# Patient Record
Sex: Male | Born: 1935
Health system: Southern US, Community
[De-identification: ages and names within clinical notes are randomized; demographics above are authoritative.]

## PROBLEM LIST (undated history)

## (undated) DIAGNOSIS — I6529 Occlusion and stenosis of unspecified carotid artery: Secondary | ICD-10-CM

## (undated) DIAGNOSIS — I10 Essential (primary) hypertension: Secondary | ICD-10-CM

## (undated) DIAGNOSIS — C801 Malignant (primary) neoplasm, unspecified: Secondary | ICD-10-CM

## (undated) HISTORY — DX: Malignant (primary) neoplasm, unspecified: C80.1

## (undated) HISTORY — DX: Occlusion and stenosis of unspecified carotid artery: I65.29

## (undated) HISTORY — PX: SKIN CANCER EXCISION: SHX779

## (undated) HISTORY — DX: Essential (primary) hypertension: I10

## (undated) HISTORY — PX: TOOTH EXTRACTION: SUR596

## (undated) HISTORY — PX: TONSILLECTOMY: SUR1361

---

## 2011-10-29 DIAGNOSIS — R7309 Other abnormal glucose: Secondary | ICD-10-CM | POA: Diagnosis not present

## 2011-10-29 DIAGNOSIS — I10 Essential (primary) hypertension: Secondary | ICD-10-CM | POA: Diagnosis not present

## 2011-10-29 DIAGNOSIS — E785 Hyperlipidemia, unspecified: Secondary | ICD-10-CM | POA: Diagnosis not present

## 2011-12-07 DIAGNOSIS — Z23 Encounter for immunization: Secondary | ICD-10-CM | POA: Diagnosis not present

## 2012-02-01 DIAGNOSIS — I10 Essential (primary) hypertension: Secondary | ICD-10-CM | POA: Diagnosis not present

## 2012-02-01 DIAGNOSIS — R7309 Other abnormal glucose: Secondary | ICD-10-CM | POA: Diagnosis not present

## 2012-02-01 DIAGNOSIS — E785 Hyperlipidemia, unspecified: Secondary | ICD-10-CM | POA: Diagnosis not present

## 2012-04-29 DIAGNOSIS — R7309 Other abnormal glucose: Secondary | ICD-10-CM | POA: Diagnosis not present

## 2012-04-29 DIAGNOSIS — E785 Hyperlipidemia, unspecified: Secondary | ICD-10-CM | POA: Diagnosis not present

## 2012-10-27 DIAGNOSIS — R7309 Other abnormal glucose: Secondary | ICD-10-CM | POA: Diagnosis not present

## 2012-10-27 DIAGNOSIS — E785 Hyperlipidemia, unspecified: Secondary | ICD-10-CM | POA: Diagnosis not present

## 2012-10-27 DIAGNOSIS — I10 Essential (primary) hypertension: Secondary | ICD-10-CM | POA: Diagnosis not present

## 2012-12-01 DIAGNOSIS — H524 Presbyopia: Secondary | ICD-10-CM | POA: Diagnosis not present

## 2012-12-01 DIAGNOSIS — H5200 Hypermetropia, unspecified eye: Secondary | ICD-10-CM | POA: Diagnosis not present

## 2012-12-01 DIAGNOSIS — H25099 Other age-related incipient cataract, unspecified eye: Secondary | ICD-10-CM | POA: Diagnosis not present

## 2012-12-01 DIAGNOSIS — H2510 Age-related nuclear cataract, unspecified eye: Secondary | ICD-10-CM | POA: Diagnosis not present

## 2013-01-02 DIAGNOSIS — Z23 Encounter for immunization: Secondary | ICD-10-CM | POA: Diagnosis not present

## 2013-05-03 DIAGNOSIS — Z125 Encounter for screening for malignant neoplasm of prostate: Secondary | ICD-10-CM | POA: Diagnosis not present

## 2013-05-03 DIAGNOSIS — I10 Essential (primary) hypertension: Secondary | ICD-10-CM | POA: Diagnosis not present

## 2013-05-03 DIAGNOSIS — E785 Hyperlipidemia, unspecified: Secondary | ICD-10-CM | POA: Diagnosis not present

## 2013-05-03 DIAGNOSIS — J40 Bronchitis, not specified as acute or chronic: Secondary | ICD-10-CM | POA: Diagnosis not present

## 2013-05-03 DIAGNOSIS — R7309 Other abnormal glucose: Secondary | ICD-10-CM | POA: Diagnosis not present

## 2013-11-03 DIAGNOSIS — E785 Hyperlipidemia, unspecified: Secondary | ICD-10-CM | POA: Diagnosis not present

## 2013-11-03 DIAGNOSIS — R7309 Other abnormal glucose: Secondary | ICD-10-CM | POA: Diagnosis not present

## 2013-11-03 DIAGNOSIS — I10 Essential (primary) hypertension: Secondary | ICD-10-CM | POA: Diagnosis not present

## 2013-12-11 DIAGNOSIS — Z23 Encounter for immunization: Secondary | ICD-10-CM | POA: Diagnosis not present

## 2014-03-06 DIAGNOSIS — R946 Abnormal results of thyroid function studies: Secondary | ICD-10-CM | POA: Diagnosis not present

## 2014-05-07 DIAGNOSIS — E785 Hyperlipidemia, unspecified: Secondary | ICD-10-CM | POA: Diagnosis not present

## 2014-05-07 DIAGNOSIS — I10 Essential (primary) hypertension: Secondary | ICD-10-CM | POA: Diagnosis not present

## 2014-05-07 DIAGNOSIS — R739 Hyperglycemia, unspecified: Secondary | ICD-10-CM | POA: Diagnosis not present

## 2014-05-07 DIAGNOSIS — R946 Abnormal results of thyroid function studies: Secondary | ICD-10-CM | POA: Diagnosis not present

## 2014-11-07 DIAGNOSIS — I10 Essential (primary) hypertension: Secondary | ICD-10-CM | POA: Diagnosis not present

## 2014-11-07 DIAGNOSIS — R739 Hyperglycemia, unspecified: Secondary | ICD-10-CM | POA: Diagnosis not present

## 2014-11-07 DIAGNOSIS — E785 Hyperlipidemia, unspecified: Secondary | ICD-10-CM | POA: Diagnosis not present

## 2014-12-18 DIAGNOSIS — Z23 Encounter for immunization: Secondary | ICD-10-CM | POA: Diagnosis not present

## 2015-05-07 DIAGNOSIS — I10 Essential (primary) hypertension: Secondary | ICD-10-CM | POA: Diagnosis not present

## 2015-05-07 DIAGNOSIS — R739 Hyperglycemia, unspecified: Secondary | ICD-10-CM | POA: Diagnosis not present

## 2015-05-07 DIAGNOSIS — E785 Hyperlipidemia, unspecified: Secondary | ICD-10-CM | POA: Diagnosis not present

## 2015-05-07 DIAGNOSIS — R946 Abnormal results of thyroid function studies: Secondary | ICD-10-CM | POA: Diagnosis not present

## 2015-11-11 DIAGNOSIS — R739 Hyperglycemia, unspecified: Secondary | ICD-10-CM | POA: Diagnosis not present

## 2015-11-11 DIAGNOSIS — E785 Hyperlipidemia, unspecified: Secondary | ICD-10-CM | POA: Diagnosis not present

## 2015-11-11 DIAGNOSIS — R946 Abnormal results of thyroid function studies: Secondary | ICD-10-CM | POA: Diagnosis not present

## 2015-11-11 DIAGNOSIS — I10 Essential (primary) hypertension: Secondary | ICD-10-CM | POA: Diagnosis not present

## 2015-11-11 DIAGNOSIS — Z23 Encounter for immunization: Secondary | ICD-10-CM | POA: Diagnosis not present

## 2016-05-11 DIAGNOSIS — E7800 Pure hypercholesterolemia, unspecified: Secondary | ICD-10-CM | POA: Diagnosis not present

## 2016-05-11 DIAGNOSIS — R946 Abnormal results of thyroid function studies: Secondary | ICD-10-CM | POA: Diagnosis not present

## 2016-05-11 DIAGNOSIS — I10 Essential (primary) hypertension: Secondary | ICD-10-CM | POA: Diagnosis not present

## 2016-05-11 DIAGNOSIS — R7303 Prediabetes: Secondary | ICD-10-CM | POA: Diagnosis not present

## 2016-11-19 DIAGNOSIS — R7303 Prediabetes: Secondary | ICD-10-CM | POA: Diagnosis not present

## 2016-11-19 DIAGNOSIS — Z1389 Encounter for screening for other disorder: Secondary | ICD-10-CM | POA: Diagnosis not present

## 2016-11-19 DIAGNOSIS — I10 Essential (primary) hypertension: Secondary | ICD-10-CM | POA: Diagnosis not present

## 2016-11-19 DIAGNOSIS — Z23 Encounter for immunization: Secondary | ICD-10-CM | POA: Diagnosis not present

## 2016-11-19 DIAGNOSIS — E039 Hypothyroidism, unspecified: Secondary | ICD-10-CM | POA: Diagnosis not present

## 2016-11-19 DIAGNOSIS — E7800 Pure hypercholesterolemia, unspecified: Secondary | ICD-10-CM | POA: Diagnosis not present

## 2016-11-19 DIAGNOSIS — L570 Actinic keratosis: Secondary | ICD-10-CM | POA: Diagnosis not present

## 2016-12-03 DIAGNOSIS — C44329 Squamous cell carcinoma of skin of other parts of face: Secondary | ICD-10-CM | POA: Diagnosis not present

## 2016-12-03 DIAGNOSIS — D0339 Melanoma in situ of other parts of face: Secondary | ICD-10-CM | POA: Diagnosis not present

## 2017-01-08 DIAGNOSIS — L905 Scar conditions and fibrosis of skin: Secondary | ICD-10-CM | POA: Diagnosis not present

## 2017-01-08 DIAGNOSIS — L989 Disorder of the skin and subcutaneous tissue, unspecified: Secondary | ICD-10-CM | POA: Diagnosis not present

## 2017-01-08 DIAGNOSIS — D0339 Melanoma in situ of other parts of face: Secondary | ICD-10-CM | POA: Diagnosis not present

## 2017-01-27 DIAGNOSIS — C44329 Squamous cell carcinoma of skin of other parts of face: Secondary | ICD-10-CM | POA: Diagnosis not present

## 2017-03-11 DIAGNOSIS — X32XXXA Exposure to sunlight, initial encounter: Secondary | ICD-10-CM | POA: Diagnosis not present

## 2017-03-11 DIAGNOSIS — L570 Actinic keratosis: Secondary | ICD-10-CM | POA: Diagnosis not present

## 2017-03-11 DIAGNOSIS — Z85820 Personal history of malignant melanoma of skin: Secondary | ICD-10-CM | POA: Diagnosis not present

## 2017-03-11 DIAGNOSIS — D045 Carcinoma in situ of skin of trunk: Secondary | ICD-10-CM | POA: Diagnosis not present

## 2017-03-11 DIAGNOSIS — Z85828 Personal history of other malignant neoplasm of skin: Secondary | ICD-10-CM | POA: Diagnosis not present

## 2017-03-11 DIAGNOSIS — Z1283 Encounter for screening for malignant neoplasm of skin: Secondary | ICD-10-CM | POA: Diagnosis not present

## 2017-03-11 DIAGNOSIS — Z08 Encounter for follow-up examination after completed treatment for malignant neoplasm: Secondary | ICD-10-CM | POA: Diagnosis not present

## 2017-04-22 DIAGNOSIS — Z85828 Personal history of other malignant neoplasm of skin: Secondary | ICD-10-CM | POA: Diagnosis not present

## 2017-04-22 DIAGNOSIS — L570 Actinic keratosis: Secondary | ICD-10-CM | POA: Diagnosis not present

## 2017-04-22 DIAGNOSIS — X32XXXD Exposure to sunlight, subsequent encounter: Secondary | ICD-10-CM | POA: Diagnosis not present

## 2017-04-22 DIAGNOSIS — Z08 Encounter for follow-up examination after completed treatment for malignant neoplasm: Secondary | ICD-10-CM | POA: Diagnosis not present

## 2017-05-25 DIAGNOSIS — E7800 Pure hypercholesterolemia, unspecified: Secondary | ICD-10-CM | POA: Diagnosis not present

## 2017-05-25 DIAGNOSIS — E039 Hypothyroidism, unspecified: Secondary | ICD-10-CM | POA: Diagnosis not present

## 2017-05-25 DIAGNOSIS — R739 Hyperglycemia, unspecified: Secondary | ICD-10-CM | POA: Diagnosis not present

## 2017-05-25 DIAGNOSIS — I10 Essential (primary) hypertension: Secondary | ICD-10-CM | POA: Diagnosis not present

## 2017-06-03 DIAGNOSIS — Z08 Encounter for follow-up examination after completed treatment for malignant neoplasm: Secondary | ICD-10-CM | POA: Diagnosis not present

## 2017-06-03 DIAGNOSIS — Z85820 Personal history of malignant melanoma of skin: Secondary | ICD-10-CM | POA: Diagnosis not present

## 2017-06-03 DIAGNOSIS — Z1283 Encounter for screening for malignant neoplasm of skin: Secondary | ICD-10-CM | POA: Diagnosis not present

## 2017-06-03 DIAGNOSIS — L570 Actinic keratosis: Secondary | ICD-10-CM | POA: Diagnosis not present

## 2017-06-03 DIAGNOSIS — X32XXXD Exposure to sunlight, subsequent encounter: Secondary | ICD-10-CM | POA: Diagnosis not present

## 2017-07-15 DIAGNOSIS — L570 Actinic keratosis: Secondary | ICD-10-CM | POA: Diagnosis not present

## 2017-07-15 DIAGNOSIS — X32XXXD Exposure to sunlight, subsequent encounter: Secondary | ICD-10-CM | POA: Diagnosis not present

## 2017-08-30 DIAGNOSIS — X32XXXD Exposure to sunlight, subsequent encounter: Secondary | ICD-10-CM | POA: Diagnosis not present

## 2017-08-30 DIAGNOSIS — L820 Inflamed seborrheic keratosis: Secondary | ICD-10-CM | POA: Diagnosis not present

## 2017-08-30 DIAGNOSIS — D225 Melanocytic nevi of trunk: Secondary | ICD-10-CM | POA: Diagnosis not present

## 2017-08-30 DIAGNOSIS — Z08 Encounter for follow-up examination after completed treatment for malignant neoplasm: Secondary | ICD-10-CM | POA: Diagnosis not present

## 2017-08-30 DIAGNOSIS — Z85820 Personal history of malignant melanoma of skin: Secondary | ICD-10-CM | POA: Diagnosis not present

## 2017-08-30 DIAGNOSIS — L570 Actinic keratosis: Secondary | ICD-10-CM | POA: Diagnosis not present

## 2017-08-30 DIAGNOSIS — Z1283 Encounter for screening for malignant neoplasm of skin: Secondary | ICD-10-CM | POA: Diagnosis not present

## 2017-12-08 DIAGNOSIS — Z23 Encounter for immunization: Secondary | ICD-10-CM | POA: Diagnosis not present

## 2017-12-09 DIAGNOSIS — X32XXXD Exposure to sunlight, subsequent encounter: Secondary | ICD-10-CM | POA: Diagnosis not present

## 2017-12-09 DIAGNOSIS — Z08 Encounter for follow-up examination after completed treatment for malignant neoplasm: Secondary | ICD-10-CM | POA: Diagnosis not present

## 2017-12-09 DIAGNOSIS — L570 Actinic keratosis: Secondary | ICD-10-CM | POA: Diagnosis not present

## 2017-12-09 DIAGNOSIS — Z85820 Personal history of malignant melanoma of skin: Secondary | ICD-10-CM | POA: Diagnosis not present

## 2017-12-09 DIAGNOSIS — Z1283 Encounter for screening for malignant neoplasm of skin: Secondary | ICD-10-CM | POA: Diagnosis not present

## 2018-01-06 DIAGNOSIS — Z0000 Encounter for general adult medical examination without abnormal findings: Secondary | ICD-10-CM | POA: Diagnosis not present

## 2018-01-06 DIAGNOSIS — E039 Hypothyroidism, unspecified: Secondary | ICD-10-CM | POA: Diagnosis not present

## 2018-01-06 DIAGNOSIS — I10 Essential (primary) hypertension: Secondary | ICD-10-CM | POA: Diagnosis not present

## 2018-01-06 DIAGNOSIS — E7800 Pure hypercholesterolemia, unspecified: Secondary | ICD-10-CM | POA: Diagnosis not present

## 2018-01-06 DIAGNOSIS — R7303 Prediabetes: Secondary | ICD-10-CM | POA: Diagnosis not present

## 2018-03-10 DIAGNOSIS — Z85820 Personal history of malignant melanoma of skin: Secondary | ICD-10-CM | POA: Diagnosis not present

## 2018-03-10 DIAGNOSIS — Z08 Encounter for follow-up examination after completed treatment for malignant neoplasm: Secondary | ICD-10-CM | POA: Diagnosis not present

## 2018-03-10 DIAGNOSIS — Z1283 Encounter for screening for malignant neoplasm of skin: Secondary | ICD-10-CM | POA: Diagnosis not present

## 2018-03-10 DIAGNOSIS — L570 Actinic keratosis: Secondary | ICD-10-CM | POA: Diagnosis not present

## 2018-03-10 DIAGNOSIS — X32XXXD Exposure to sunlight, subsequent encounter: Secondary | ICD-10-CM | POA: Diagnosis not present

## 2018-07-20 DIAGNOSIS — R7303 Prediabetes: Secondary | ICD-10-CM | POA: Diagnosis not present

## 2018-07-20 DIAGNOSIS — E7800 Pure hypercholesterolemia, unspecified: Secondary | ICD-10-CM | POA: Diagnosis not present

## 2018-07-20 DIAGNOSIS — I10 Essential (primary) hypertension: Secondary | ICD-10-CM | POA: Diagnosis not present

## 2018-08-31 DIAGNOSIS — T7840XA Allergy, unspecified, initial encounter: Secondary | ICD-10-CM | POA: Diagnosis not present

## 2018-08-31 DIAGNOSIS — I10 Essential (primary) hypertension: Secondary | ICD-10-CM | POA: Diagnosis not present

## 2018-12-07 DIAGNOSIS — Z23 Encounter for immunization: Secondary | ICD-10-CM | POA: Diagnosis not present

## 2019-01-18 DIAGNOSIS — I10 Essential (primary) hypertension: Secondary | ICD-10-CM | POA: Diagnosis not present

## 2019-01-18 DIAGNOSIS — R7303 Prediabetes: Secondary | ICD-10-CM | POA: Diagnosis not present

## 2019-01-18 DIAGNOSIS — Z0000 Encounter for general adult medical examination without abnormal findings: Secondary | ICD-10-CM | POA: Diagnosis not present

## 2019-01-18 DIAGNOSIS — E039 Hypothyroidism, unspecified: Secondary | ICD-10-CM | POA: Diagnosis not present

## 2019-01-18 DIAGNOSIS — E7800 Pure hypercholesterolemia, unspecified: Secondary | ICD-10-CM | POA: Diagnosis not present

## 2019-05-02 DIAGNOSIS — I10 Essential (primary) hypertension: Secondary | ICD-10-CM | POA: Diagnosis not present

## 2019-05-02 DIAGNOSIS — E7800 Pure hypercholesterolemia, unspecified: Secondary | ICD-10-CM | POA: Diagnosis not present

## 2019-05-02 DIAGNOSIS — E039 Hypothyroidism, unspecified: Secondary | ICD-10-CM | POA: Diagnosis not present

## 2019-06-15 DIAGNOSIS — E7800 Pure hypercholesterolemia, unspecified: Secondary | ICD-10-CM | POA: Diagnosis not present

## 2019-06-15 DIAGNOSIS — E039 Hypothyroidism, unspecified: Secondary | ICD-10-CM | POA: Diagnosis not present

## 2019-06-15 DIAGNOSIS — I10 Essential (primary) hypertension: Secondary | ICD-10-CM | POA: Diagnosis not present

## 2019-07-28 DIAGNOSIS — C4330 Malignant melanoma of unspecified part of face: Secondary | ICD-10-CM | POA: Diagnosis not present

## 2019-07-28 DIAGNOSIS — R7303 Prediabetes: Secondary | ICD-10-CM | POA: Diagnosis not present

## 2019-07-28 DIAGNOSIS — E7800 Pure hypercholesterolemia, unspecified: Secondary | ICD-10-CM | POA: Diagnosis not present

## 2019-07-28 DIAGNOSIS — E039 Hypothyroidism, unspecified: Secondary | ICD-10-CM | POA: Diagnosis not present

## 2019-07-28 DIAGNOSIS — I10 Essential (primary) hypertension: Secondary | ICD-10-CM | POA: Diagnosis not present

## 2019-08-17 DIAGNOSIS — D225 Melanocytic nevi of trunk: Secondary | ICD-10-CM | POA: Diagnosis not present

## 2019-08-17 DIAGNOSIS — L821 Other seborrheic keratosis: Secondary | ICD-10-CM | POA: Diagnosis not present

## 2019-08-17 DIAGNOSIS — Z08 Encounter for follow-up examination after completed treatment for malignant neoplasm: Secondary | ICD-10-CM | POA: Diagnosis not present

## 2019-08-17 DIAGNOSIS — Z85820 Personal history of malignant melanoma of skin: Secondary | ICD-10-CM | POA: Diagnosis not present

## 2019-08-17 DIAGNOSIS — D2272 Melanocytic nevi of left lower limb, including hip: Secondary | ICD-10-CM | POA: Diagnosis not present

## 2019-08-17 DIAGNOSIS — L820 Inflamed seborrheic keratosis: Secondary | ICD-10-CM | POA: Diagnosis not present

## 2019-08-17 DIAGNOSIS — D485 Neoplasm of uncertain behavior of skin: Secondary | ICD-10-CM | POA: Diagnosis not present

## 2019-08-17 DIAGNOSIS — D0461 Carcinoma in situ of skin of right upper limb, including shoulder: Secondary | ICD-10-CM | POA: Diagnosis not present

## 2019-08-17 DIAGNOSIS — Z1283 Encounter for screening for malignant neoplasm of skin: Secondary | ICD-10-CM | POA: Diagnosis not present

## 2019-08-30 DIAGNOSIS — E7800 Pure hypercholesterolemia, unspecified: Secondary | ICD-10-CM | POA: Diagnosis not present

## 2019-08-30 DIAGNOSIS — E039 Hypothyroidism, unspecified: Secondary | ICD-10-CM | POA: Diagnosis not present

## 2019-08-30 DIAGNOSIS — I10 Essential (primary) hypertension: Secondary | ICD-10-CM | POA: Diagnosis not present

## 2019-10-05 DIAGNOSIS — Z85828 Personal history of other malignant neoplasm of skin: Secondary | ICD-10-CM | POA: Diagnosis not present

## 2019-10-05 DIAGNOSIS — Z08 Encounter for follow-up examination after completed treatment for malignant neoplasm: Secondary | ICD-10-CM | POA: Diagnosis not present

## 2019-12-14 DIAGNOSIS — E7800 Pure hypercholesterolemia, unspecified: Secondary | ICD-10-CM | POA: Diagnosis not present

## 2019-12-14 DIAGNOSIS — E039 Hypothyroidism, unspecified: Secondary | ICD-10-CM | POA: Diagnosis not present

## 2019-12-14 DIAGNOSIS — I10 Essential (primary) hypertension: Secondary | ICD-10-CM | POA: Diagnosis not present

## 2020-02-15 DIAGNOSIS — D225 Melanocytic nevi of trunk: Secondary | ICD-10-CM | POA: Diagnosis not present

## 2020-02-15 DIAGNOSIS — Z1283 Encounter for screening for malignant neoplasm of skin: Secondary | ICD-10-CM | POA: Diagnosis not present

## 2020-02-15 DIAGNOSIS — L820 Inflamed seborrheic keratosis: Secondary | ICD-10-CM | POA: Diagnosis not present

## 2020-02-15 DIAGNOSIS — Z85820 Personal history of malignant melanoma of skin: Secondary | ICD-10-CM | POA: Diagnosis not present

## 2020-02-15 DIAGNOSIS — Z08 Encounter for follow-up examination after completed treatment for malignant neoplasm: Secondary | ICD-10-CM | POA: Diagnosis not present

## 2020-02-15 DIAGNOSIS — C44222 Squamous cell carcinoma of skin of right ear and external auricular canal: Secondary | ICD-10-CM | POA: Diagnosis not present

## 2020-02-15 DIAGNOSIS — C44202 Unspecified malignant neoplasm of skin of right ear and external auricular canal: Secondary | ICD-10-CM | POA: Diagnosis not present

## 2020-02-15 DIAGNOSIS — L821 Other seborrheic keratosis: Secondary | ICD-10-CM | POA: Diagnosis not present

## 2020-03-14 DIAGNOSIS — I10 Essential (primary) hypertension: Secondary | ICD-10-CM | POA: Diagnosis not present

## 2020-03-14 DIAGNOSIS — E039 Hypothyroidism, unspecified: Secondary | ICD-10-CM | POA: Diagnosis not present

## 2020-03-14 DIAGNOSIS — E7800 Pure hypercholesterolemia, unspecified: Secondary | ICD-10-CM | POA: Diagnosis not present

## 2020-03-28 DIAGNOSIS — Z08 Encounter for follow-up examination after completed treatment for malignant neoplasm: Secondary | ICD-10-CM | POA: Diagnosis not present

## 2020-03-28 DIAGNOSIS — Z85828 Personal history of other malignant neoplasm of skin: Secondary | ICD-10-CM | POA: Diagnosis not present

## 2020-05-01 DIAGNOSIS — E039 Hypothyroidism, unspecified: Secondary | ICD-10-CM | POA: Diagnosis not present

## 2020-05-01 DIAGNOSIS — I10 Essential (primary) hypertension: Secondary | ICD-10-CM | POA: Diagnosis not present

## 2020-05-01 DIAGNOSIS — R011 Cardiac murmur, unspecified: Secondary | ICD-10-CM | POA: Diagnosis not present

## 2020-05-01 DIAGNOSIS — R7303 Prediabetes: Secondary | ICD-10-CM | POA: Diagnosis not present

## 2020-05-01 DIAGNOSIS — E7800 Pure hypercholesterolemia, unspecified: Secondary | ICD-10-CM | POA: Diagnosis not present

## 2020-05-03 ENCOUNTER — Other Ambulatory Visit (HOSPITAL_COMMUNITY): Payer: Self-pay | Admitting: Family Medicine

## 2020-05-03 DIAGNOSIS — R011 Cardiac murmur, unspecified: Secondary | ICD-10-CM

## 2020-05-08 ENCOUNTER — Encounter (HOSPITAL_COMMUNITY): Payer: Self-pay | Admitting: Family Medicine

## 2020-06-07 ENCOUNTER — Other Ambulatory Visit: Payer: Self-pay

## 2020-06-07 ENCOUNTER — Ambulatory Visit (HOSPITAL_COMMUNITY): Payer: PPO | Attending: Cardiology

## 2020-06-07 DIAGNOSIS — R011 Cardiac murmur, unspecified: Secondary | ICD-10-CM | POA: Insufficient documentation

## 2020-06-07 LAB — ECHOCARDIOGRAM COMPLETE
AR max vel: 5.26 cm2
AV Area VTI: 5.08 cm2
AV Area mean vel: 4.39 cm2
AV Mean grad: 9 mmHg
AV Peak grad: 15.2 mmHg
Ao pk vel: 1.95 m/s
Area-P 1/2: 5.36 cm2
S' Lateral: 2.3 cm

## 2020-06-20 DIAGNOSIS — Z85828 Personal history of other malignant neoplasm of skin: Secondary | ICD-10-CM | POA: Diagnosis not present

## 2020-06-20 DIAGNOSIS — Z08 Encounter for follow-up examination after completed treatment for malignant neoplasm: Secondary | ICD-10-CM | POA: Diagnosis not present

## 2020-06-20 DIAGNOSIS — B078 Other viral warts: Secondary | ICD-10-CM | POA: Diagnosis not present

## 2020-06-24 DIAGNOSIS — E7800 Pure hypercholesterolemia, unspecified: Secondary | ICD-10-CM | POA: Diagnosis not present

## 2020-06-24 DIAGNOSIS — E039 Hypothyroidism, unspecified: Secondary | ICD-10-CM | POA: Diagnosis not present

## 2020-06-24 DIAGNOSIS — I10 Essential (primary) hypertension: Secondary | ICD-10-CM | POA: Diagnosis not present

## 2020-08-23 DIAGNOSIS — E7800 Pure hypercholesterolemia, unspecified: Secondary | ICD-10-CM | POA: Diagnosis not present

## 2020-08-23 DIAGNOSIS — E039 Hypothyroidism, unspecified: Secondary | ICD-10-CM | POA: Diagnosis not present

## 2020-08-23 DIAGNOSIS — I10 Essential (primary) hypertension: Secondary | ICD-10-CM | POA: Diagnosis not present

## 2020-10-04 DIAGNOSIS — E7800 Pure hypercholesterolemia, unspecified: Secondary | ICD-10-CM | POA: Diagnosis not present

## 2020-10-04 DIAGNOSIS — E039 Hypothyroidism, unspecified: Secondary | ICD-10-CM | POA: Diagnosis not present

## 2020-10-04 DIAGNOSIS — I10 Essential (primary) hypertension: Secondary | ICD-10-CM | POA: Diagnosis not present

## 2020-11-11 DIAGNOSIS — Z23 Encounter for immunization: Secondary | ICD-10-CM | POA: Diagnosis not present

## 2020-11-11 DIAGNOSIS — R7303 Prediabetes: Secondary | ICD-10-CM | POA: Diagnosis not present

## 2020-11-11 DIAGNOSIS — E7800 Pure hypercholesterolemia, unspecified: Secondary | ICD-10-CM | POA: Diagnosis not present

## 2020-11-11 DIAGNOSIS — R7309 Other abnormal glucose: Secondary | ICD-10-CM | POA: Diagnosis not present

## 2020-11-11 DIAGNOSIS — I10 Essential (primary) hypertension: Secondary | ICD-10-CM | POA: Diagnosis not present

## 2020-11-11 DIAGNOSIS — E039 Hypothyroidism, unspecified: Secondary | ICD-10-CM | POA: Diagnosis not present

## 2020-11-18 DIAGNOSIS — E7800 Pure hypercholesterolemia, unspecified: Secondary | ICD-10-CM | POA: Diagnosis not present

## 2020-11-18 DIAGNOSIS — E039 Hypothyroidism, unspecified: Secondary | ICD-10-CM | POA: Diagnosis not present

## 2020-11-18 DIAGNOSIS — I10 Essential (primary) hypertension: Secondary | ICD-10-CM | POA: Diagnosis not present

## 2021-01-16 DIAGNOSIS — I10 Essential (primary) hypertension: Secondary | ICD-10-CM | POA: Diagnosis not present

## 2021-01-16 DIAGNOSIS — E039 Hypothyroidism, unspecified: Secondary | ICD-10-CM | POA: Diagnosis not present

## 2021-01-16 DIAGNOSIS — E7800 Pure hypercholesterolemia, unspecified: Secondary | ICD-10-CM | POA: Diagnosis not present

## 2021-05-15 DIAGNOSIS — H259 Unspecified age-related cataract: Secondary | ICD-10-CM | POA: Diagnosis not present

## 2021-05-15 DIAGNOSIS — H5203 Hypermetropia, bilateral: Secondary | ICD-10-CM | POA: Diagnosis not present

## 2021-05-22 DIAGNOSIS — R7303 Prediabetes: Secondary | ICD-10-CM | POA: Diagnosis not present

## 2021-05-22 DIAGNOSIS — M7989 Other specified soft tissue disorders: Secondary | ICD-10-CM | POA: Diagnosis not present

## 2021-05-22 DIAGNOSIS — E7800 Pure hypercholesterolemia, unspecified: Secondary | ICD-10-CM | POA: Diagnosis not present

## 2021-05-22 DIAGNOSIS — E039 Hypothyroidism, unspecified: Secondary | ICD-10-CM | POA: Diagnosis not present

## 2021-05-22 DIAGNOSIS — I10 Essential (primary) hypertension: Secondary | ICD-10-CM | POA: Diagnosis not present

## 2021-05-26 DIAGNOSIS — H25811 Combined forms of age-related cataract, right eye: Secondary | ICD-10-CM | POA: Diagnosis not present

## 2021-05-26 DIAGNOSIS — H25812 Combined forms of age-related cataract, left eye: Secondary | ICD-10-CM | POA: Diagnosis not present

## 2021-05-27 DIAGNOSIS — Z0000 Encounter for general adult medical examination without abnormal findings: Secondary | ICD-10-CM | POA: Diagnosis not present

## 2021-06-11 DIAGNOSIS — H35341 Macular cyst, hole, or pseudohole, right eye: Secondary | ICD-10-CM | POA: Diagnosis not present

## 2021-06-11 DIAGNOSIS — H25813 Combined forms of age-related cataract, bilateral: Secondary | ICD-10-CM | POA: Diagnosis not present

## 2021-06-11 DIAGNOSIS — H34212 Partial retinal artery occlusion, left eye: Secondary | ICD-10-CM | POA: Diagnosis not present

## 2021-06-11 DIAGNOSIS — H35372 Puckering of macula, left eye: Secondary | ICD-10-CM | POA: Diagnosis not present

## 2021-07-09 ENCOUNTER — Other Ambulatory Visit: Payer: Self-pay | Admitting: Family Medicine

## 2021-07-09 DIAGNOSIS — H1132 Conjunctival hemorrhage, left eye: Secondary | ICD-10-CM | POA: Diagnosis not present

## 2021-07-09 DIAGNOSIS — H34212 Partial retinal artery occlusion, left eye: Secondary | ICD-10-CM

## 2021-07-09 DIAGNOSIS — R7303 Prediabetes: Secondary | ICD-10-CM | POA: Diagnosis not present

## 2021-07-09 DIAGNOSIS — E7800 Pure hypercholesterolemia, unspecified: Secondary | ICD-10-CM | POA: Diagnosis not present

## 2021-07-09 DIAGNOSIS — I10 Essential (primary) hypertension: Secondary | ICD-10-CM | POA: Diagnosis not present

## 2021-07-09 DIAGNOSIS — M62838 Other muscle spasm: Secondary | ICD-10-CM | POA: Diagnosis not present

## 2021-07-30 ENCOUNTER — Ambulatory Visit
Admission: RE | Admit: 2021-07-30 | Discharge: 2021-07-30 | Disposition: A | Discharge status: Home / Self Care | Payer: PPO | Source: Ambulatory Visit | Attending: Family Medicine | Admitting: Family Medicine

## 2021-07-30 DIAGNOSIS — I6523 Occlusion and stenosis of bilateral carotid arteries: Secondary | ICD-10-CM | POA: Diagnosis not present

## 2021-07-30 DIAGNOSIS — I10 Essential (primary) hypertension: Secondary | ICD-10-CM | POA: Diagnosis not present

## 2021-07-30 DIAGNOSIS — H34212 Partial retinal artery occlusion, left eye: Secondary | ICD-10-CM

## 2021-08-22 DIAGNOSIS — L109 Pemphigus, unspecified: Secondary | ICD-10-CM | POA: Diagnosis not present

## 2021-09-03 DIAGNOSIS — L258 Unspecified contact dermatitis due to other agents: Secondary | ICD-10-CM | POA: Diagnosis not present

## 2021-09-04 ENCOUNTER — Encounter: Payer: Self-pay | Admitting: Vascular Surgery

## 2021-09-04 ENCOUNTER — Ambulatory Visit: Payer: PPO | Admitting: Vascular Surgery

## 2021-09-04 VITALS — BP 122/64 | HR 100 | Temp 98.4°F | Resp 20 | Ht 70.0 in | Wt 156.6 lb

## 2021-09-04 DIAGNOSIS — I83813 Varicose veins of bilateral lower extremities with pain: Secondary | ICD-10-CM | POA: Diagnosis not present

## 2021-09-04 MED ORDER — ROSUVASTATIN CALCIUM 10 MG PO TABS: 10.0000 mg | ORAL_TABLET | Freq: Every day | ORAL | 11 refills | Status: DC

## 2021-09-04 NOTE — Progress Notes (Signed)
ASSESSMENT & PLAN   ASYMPTOMATIC 50 TO 69% RIGHT CAROTID STENOSIS: This patient has a asymptomatic 50 to 69% right carotid stenosis with no significant stenosis on the left.  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  Given that the stenosis on the left, by our criteria is less than 39% I do not think he needs any further work-up at this time.  We do need to keep a close eye on the right carotid stenosis and I have ordered a follow-up carotid duplex scan in 6 months.  I will see him back at that time.  I have instructed him to begin taking 81 mg of aspirin daily.  I have also sent a prescription for Crestor, 10 mg daily, to his pharmacy.  I explained that we would only consider right carotid endarterectomy if you develop new symptoms or the stenosis progressed to greater than 80%.  I will see him back in 6 months.  He knows to call sooner if he has problems.  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  The consult is requested by Dr. Shirline Frees.  HPI:   Brian Bolton is a 88 y.o. 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  This prompted a carotid duplex scan that was done on 07/30/2021.  This showed a 50 to 69% right carotid stenosis with a less than 50% left carotid stenosis.  The patient was sent for vascular consultation.  I have reviewed the records from the referring office.  The patient was seen on 07/29/2021.  He was seen by his ophthalmologist on 06/11/2021 for a right eye macular hole.  This had been present since 2020.  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  This prompted a carotid duplex scan which showed some mild carotid disease.  He is sent for vascular consultation.  On my history, the patient is right-handed.  He denies any history of stroke, TIAs, expressive or receptive aphasia, or amaurosis fugax.  He does not take aspirin routinely.  He  is not on a statin.  He denies any history of claudication, rest pain, or nonhealing ulcers.  Past Medical History:  Diagnosis Date   Cancer ([place])    skin on face   Hypertension     No family history on file.  SOCIAL HISTORY: Social History   Tobacco Use   Smoking status: Never   Smokeless tobacco: Never  Substance Use Topics   Alcohol use: Never    Allergies  Allergen Reactions   Amlodipine Besylate     Other reaction(s): rash   Clindamycin Hcl     Other reaction(s): swelling   Penicillins     Other reaction(s): Unknown   Sulfa Antibiotics     Other reaction(s): Unknown    Current Outpatient Medications  Medication Sig Dispense Refill   Ascorbic Acid (VITAMIN C) 1000 MG tablet 1 tablet     cimetidine (TAGAMET) 300 MG tablet Take 300 mg by mouth 3 (three) times daily.     Ferrous Gluconate-C-Folic Acid (IRON-C PO) Take 65 mg by mouth daily with breakfast.     hydrochlorothiazide (HYDRODIURIL) 12.5 MG tablet Take 12.5 mg by mouth every morning.     losartan (COZAAR) 100 MG tablet Take 100 mg by mouth daily.     Magnesium 250 MG TABS 1 tablet with  a meal     triamcinolone cream (KENALOG) 0.1 % SMARTSIG:1 Application Topical 2-3 Times Daily     vitamin B-12 (CYANOCOBALAMIN) 1000 MCG tablet 1 tablet     No current facility-administered medications for this visit.    REVIEW OF SYSTEMS:  '[X]'$  denotes positive finding, '[ ]'$  denotes negative finding Cardiac  Comments:  Chest pain or chest pressure:    Shortness of breath upon exertion:    Short of breath when lying flat:    Irregular heart rhythm:        Vascular    Pain in calf, thigh, or hip brought on by ambulation:    Pain in feet at night that wakes you up from your sleep:     Blood clot in your veins:    Leg swelling:         Pulmonary    Oxygen at home:    Productive cough:     Wheezing:         Neurologic    Sudden weakness in arms or legs:     Sudden numbness in arms or legs:     Sudden onset of  difficulty speaking or slurred speech:    Temporary loss of vision in one eye:     Problems with dizziness:         Gastrointestinal    Blood in stool:     Vomited blood:         Genitourinary    Burning when urinating:     Blood in urine:        Psychiatric    Major depression:         Hematologic    Bleeding problems:    Problems with blood clotting too easily:        Skin    Rashes or ulcers: x       Constitutional    Fever or chills:    -  PHYSICAL EXAM:   Vitals:   09/04/21 0913 09/04/21 0916  BP: 115/62 122/64  Pulse: 100   Resp: 20   Temp: 98.4 F (36.9 C)   SpO2: 96%   Weight: 156 lb 9.6 oz (71 kg)   Height: '5\' 10"'$  (1.778 m)    Body mass index is 22.47 kg/m. GENERAL: The patient is a well-nourished male, in no acute distress. The vital signs are documented above. CARDIAC: There is a regular rate and rhythm.  VASCULAR: I do not detect carotid bruits. He has palpable femoral PULMONARY: There is good air exchange bilaterally without wheezing or rales. ABDOMEN: Soft and non-tender with normal pitched bowel sounds.  I do not palpate an aneurysm MUSCULOSKELETAL: There are no major deformities. NEUROLOGIC: No focal weakness or paresthesias are detected. SKIN: There are no ulcers or rashes noted. PSYCHIATRIC: The patient has a normal affect.  DATA:    CAROTID DUPLEX: I have reviewed the carotid duplex scan that was done on 5 04/30/2021.  On the right side there was a 50 to 69% stenosis in the internal carotid artery.  Peak systolic velocity was 791 cm/s with an end-diastolic velocity of 67 cm/s.  The right vertebral artery was patent with antegrade flow.  On the left side there was a less than 50% stenosis by their criteria.  Peak systolic velocity was 505 cm/s with an end-diastolic velocity of 18 cm/s.  By our velocity criteria this would be a less than 39% stenosis.  The left vertebral artery was patent with antegrade flow.  Brian Bolton  [facility]

## 2021-09-05 ENCOUNTER — Other Ambulatory Visit: Payer: Self-pay

## 2021-09-05 DIAGNOSIS — I6523 Occlusion and stenosis of bilateral carotid arteries: Secondary | ICD-10-CM

## 2021-09-11 DIAGNOSIS — L120 Bullous pemphigoid: Secondary | ICD-10-CM | POA: Diagnosis not present

## 2021-09-11 DIAGNOSIS — Z1283 Encounter for screening for malignant neoplasm of skin: Secondary | ICD-10-CM | POA: Diagnosis not present

## 2021-09-19 DIAGNOSIS — L120 Bullous pemphigoid: Secondary | ICD-10-CM | POA: Diagnosis not present

## 2021-10-10 DIAGNOSIS — L120 Bullous pemphigoid: Secondary | ICD-10-CM | POA: Diagnosis not present

## 2021-11-21 DIAGNOSIS — L120 Bullous pemphigoid: Secondary | ICD-10-CM | POA: Diagnosis not present

## 2021-12-01 DIAGNOSIS — R7303 Prediabetes: Secondary | ICD-10-CM | POA: Diagnosis not present

## 2021-12-01 DIAGNOSIS — L109 Pemphigus, unspecified: Secondary | ICD-10-CM | POA: Diagnosis not present

## 2021-12-01 DIAGNOSIS — E7800 Pure hypercholesterolemia, unspecified: Secondary | ICD-10-CM | POA: Diagnosis not present

## 2021-12-01 DIAGNOSIS — I10 Essential (primary) hypertension: Secondary | ICD-10-CM | POA: Diagnosis not present

## 2021-12-01 DIAGNOSIS — E039 Hypothyroidism, unspecified: Secondary | ICD-10-CM | POA: Diagnosis not present

## 2022-01-02 DIAGNOSIS — L120 Bullous pemphigoid: Secondary | ICD-10-CM | POA: Diagnosis not present

## 2022-02-12 DIAGNOSIS — D044 Carcinoma in situ of skin of scalp and neck: Secondary | ICD-10-CM | POA: Diagnosis not present

## 2022-02-12 DIAGNOSIS — Z08 Encounter for follow-up examination after completed treatment for malignant neoplasm: Secondary | ICD-10-CM | POA: Diagnosis not present

## 2022-02-12 DIAGNOSIS — D0461 Carcinoma in situ of skin of right upper limb, including shoulder: Secondary | ICD-10-CM | POA: Diagnosis not present

## 2022-02-12 DIAGNOSIS — C4441 Basal cell carcinoma of skin of scalp and neck: Secondary | ICD-10-CM | POA: Diagnosis not present

## 2022-02-12 DIAGNOSIS — Z1283 Encounter for screening for malignant neoplasm of skin: Secondary | ICD-10-CM | POA: Diagnosis not present

## 2022-02-12 DIAGNOSIS — Z85820 Personal history of malignant melanoma of skin: Secondary | ICD-10-CM | POA: Diagnosis not present

## 2022-02-12 DIAGNOSIS — D225 Melanocytic nevi of trunk: Secondary | ICD-10-CM | POA: Diagnosis not present

## 2022-03-05 ENCOUNTER — Encounter: Payer: Self-pay | Admitting: Vascular Surgery

## 2022-03-05 ENCOUNTER — Ambulatory Visit (INDEPENDENT_AMBULATORY_CARE_PROVIDER_SITE_OTHER): Payer: PPO | Admitting: Vascular Surgery

## 2022-03-05 ENCOUNTER — Ambulatory Visit (HOSPITAL_COMMUNITY)
Admission: RE | Admit: 2022-03-05 | Discharge: 2022-03-05 | Disposition: A | Discharge status: Home / Self Care | Payer: PPO | Source: Ambulatory Visit | Attending: Vascular Surgery | Admitting: Vascular Surgery

## 2022-03-05 VITALS — BP 152/69 | HR 102 | Temp 98.7°F | Resp 20 | Ht 70.0 in | Wt 159.0 lb

## 2022-03-05 DIAGNOSIS — I6523 Occlusion and stenosis of bilateral carotid arteries: Secondary | ICD-10-CM | POA: Insufficient documentation

## 2022-03-05 DIAGNOSIS — I6521 Occlusion and stenosis of right carotid artery: Secondary | ICD-10-CM | POA: Diagnosis not present

## 2022-03-05 NOTE — Progress Notes (Signed)
REASON FOR VISIT:   Follow-up of carotid disease.  MEDICAL ISSUES:   RIGHT CAROTID STENOSIS: This patient has an asymptomatic 40 to 59% right carotid stenosis.  We have reviewed the potential symptoms of cerebrovascular disease.  He understands we would not consider right carotid endarterectomy unless the stenosis progressed to greater than 80% or he develop new right hemispheric symptoms.  I have recommended a carotid duplex scan on a yearly basis.  We will get him seen in 1 year with a carotid duplex scan on the PA schedule.  He knows to call if he develops any new symptoms.  He is on aspirin and is on a statin.  HPI:   Brian Bolton is a pleasant 89 y.o. male who I saw in consultation on 09/04/2021 for a Hollenhorst plaque in the left eye.  This prompted a duplex scan which showed a 50 to 69% right carotid stenosis and a less than 39% left carotid stenosis.  Given that there was no significant stenosis on the left I did not recommend further workup at that time.  I recommended a follow-up carotid duplex scan in 6 months and he comes in for that follow-up visit.  I did instruct him to begin taking 81 mg of aspirin daily.  I also sent him a prescription for Crestor.  Since I saw him last, he denies any history of stroke, TIAs, expressive or receptive aphasia, or amaurosis fugax.  He is on 81 mg of aspirin daily and also on 10 mg of Crestor.  He is not a smoker.  There have been no significant changes in his medical history except that he does have a tear in his retina on the right but is trying to avoid any surgery.  Past Medical History:  Diagnosis Date   Cancer ([place])    skin on face   Hypertension     History reviewed. No pertinent family history.  SOCIAL HISTORY: Social History   Tobacco Use   Smoking status: Never   Smokeless tobacco: Never  Substance Use Topics   Alcohol use: Never    Allergies  Allergen Reactions   Amlodipine Besylate     Other reaction(s): rash    Clindamycin Hcl     Other reaction(s): swelling   Penicillins     Other reaction(s): Unknown   Sulfa Antibiotics     Other reaction(s): Unknown    Current Outpatient Medications  Medication Sig Dispense Refill   Ascorbic Acid (VITAMIN C) 1000 MG tablet 1 tablet     augmented betamethasone dipropionate (DIPROLENE-AF) 0.05 % cream Apply topically.     cimetidine (TAGAMET) 300 MG tablet Take 300 mg by mouth 3 (three) times daily.     Ferrous Gluconate-C-Folic Acid (IRON-C PO) Take 65 mg by mouth daily with breakfast.     hydrochlorothiazide (HYDRODIURIL) 12.5 MG tablet Take 12.5 mg by mouth every morning.     losartan (COZAAR) 100 MG tablet Take 100 mg by mouth daily.     Magnesium 250 MG TABS 1 tablet with a meal     rosuvastatin (CRESTOR) 10 MG tablet Take 1 tablet (10 mg total) by mouth daily. 30 tablet 11   triamcinolone cream (KENALOG) 0.1 % SMARTSIG:1 Application Topical 2-3 Times Daily     vitamin B-12 (CYANOCOBALAMIN) 1000 MCG tablet 1 tablet     No current facility-administered medications for this visit.    REVIEW OF SYSTEMS:  '[X]'$  denotes positive finding, '[ ]'$  denotes negative finding Cardiac  Comments:  Chest pain or chest pressure:    Shortness of breath upon exertion:    Short of breath when lying flat:    Irregular heart rhythm:        Vascular    Pain in calf, thigh, or hip brought on by ambulation:    Pain in feet at night that wakes you up from your sleep:     Blood clot in your veins:    Leg swelling:         Pulmonary    Oxygen at home:    Productive cough:     Wheezing:         Neurologic    Sudden weakness in arms or legs:     Sudden numbness in arms or legs:     Sudden onset of difficulty speaking or slurred speech:    Temporary loss of vision in one eye:     Problems with dizziness:         Gastrointestinal    Blood in stool:     Vomited blood:         Genitourinary    Burning when urinating:     Blood in urine:        Psychiatric     Major depression:         Hematologic    Bleeding problems:    Problems with blood clotting too easily:        Skin    Rashes or ulcers:        Constitutional    Fever or chills:     PHYSICAL EXAM:   Vitals:   03/05/22 1553 03/05/22 1555  BP: (!) 144/72 (!) 152/69  Pulse: (!) 102   Resp: 20   Temp: 98.7 F (37.1 C)   SpO2: 96%   Weight: 159 lb (72.1 kg)   Height: '5\' 10"'$  (1.778 m)     GENERAL: The patient is a well-nourished male, in no acute distress. The vital signs are documented above. CARDIAC: There is a regular rate and rhythm.  VASCULAR: I do not detect carotid bruits. Both feet are warm and well-perfused. He has no significant lower extremity swelling. PULMONARY: There is good air exchange bilaterally without wheezing or rales. ABDOMEN: Soft and non-tender with normal pitched bowel sounds.  MUSCULOSKELETAL: There are no major deformities or cyanosis. NEUROLOGIC: No focal weakness or paresthesias are detected. SKIN: There are no ulcers or rashes noted. PSYCHIATRIC: The patient has a normal affect.  DATA:    CAROTID DUPLEX: I have independently interpreted his carotid duplex scan today.  On the right side there is a 40 to 59% stenosis.  The right vertebral artery is patent antegrade flow.  On the left side there is a less than 39% stenosis.  The left vertebral artery is patent with antegrade flow.   Deitra Mayo [facility] [phone]

## 2022-03-12 DIAGNOSIS — Z08 Encounter for follow-up examination after completed treatment for malignant neoplasm: Secondary | ICD-10-CM | POA: Diagnosis not present

## 2022-03-12 DIAGNOSIS — Z85828 Personal history of other malignant neoplasm of skin: Secondary | ICD-10-CM | POA: Diagnosis not present

## 2022-03-12 DIAGNOSIS — L120 Bullous pemphigoid: Secondary | ICD-10-CM | POA: Diagnosis not present

## 2022-04-13 ENCOUNTER — Encounter (HOSPITAL_BASED_OUTPATIENT_CLINIC_OR_DEPARTMENT_OTHER): Payer: Self-pay | Admitting: Emergency Medicine

## 2022-04-13 ENCOUNTER — Emergency Department (HOSPITAL_BASED_OUTPATIENT_CLINIC_OR_DEPARTMENT_OTHER): Payer: PPO

## 2022-04-13 ENCOUNTER — Other Ambulatory Visit: Payer: Self-pay

## 2022-04-13 ENCOUNTER — Emergency Department (HOSPITAL_BASED_OUTPATIENT_CLINIC_OR_DEPARTMENT_OTHER)
Admission: EM | Admit: 2022-04-13 | Discharge: 2022-04-13 | Disposition: A | Discharge status: Home / Self Care | Payer: PPO | Attending: Emergency Medicine | Admitting: Emergency Medicine

## 2022-04-13 DIAGNOSIS — S0990XA Unspecified injury of head, initial encounter: Secondary | ICD-10-CM | POA: Diagnosis not present

## 2022-04-13 DIAGNOSIS — S022XXA Fracture of nasal bones, initial encounter for closed fracture: Secondary | ICD-10-CM

## 2022-04-13 DIAGNOSIS — W101XXA Fall (on)(from) sidewalk curb, initial encounter: Secondary | ICD-10-CM | POA: Insufficient documentation

## 2022-04-13 DIAGNOSIS — W19XXXA Unspecified fall, initial encounter: Secondary | ICD-10-CM

## 2022-04-13 DIAGNOSIS — R220 Localized swelling, mass and lump, head: Secondary | ICD-10-CM | POA: Diagnosis not present

## 2022-04-13 DIAGNOSIS — S80211A Abrasion, right knee, initial encounter: Secondary | ICD-10-CM | POA: Insufficient documentation

## 2022-04-13 DIAGNOSIS — S80212A Abrasion, left knee, initial encounter: Secondary | ICD-10-CM | POA: Diagnosis not present

## 2022-04-13 DIAGNOSIS — S0181XA Laceration without foreign body of other part of head, initial encounter: Secondary | ICD-10-CM | POA: Diagnosis not present

## 2022-04-13 DIAGNOSIS — Y92480 Sidewalk as the place of occurrence of the external cause: Secondary | ICD-10-CM | POA: Insufficient documentation

## 2022-04-13 DIAGNOSIS — Z79899 Other long term (current) drug therapy: Secondary | ICD-10-CM | POA: Insufficient documentation

## 2022-04-13 DIAGNOSIS — I10 Essential (primary) hypertension: Secondary | ICD-10-CM | POA: Diagnosis not present

## 2022-04-13 DIAGNOSIS — Z043 Encounter for examination and observation following other accident: Secondary | ICD-10-CM | POA: Diagnosis not present

## 2022-04-13 DIAGNOSIS — R42 Dizziness and giddiness: Secondary | ICD-10-CM | POA: Diagnosis not present

## 2022-04-13 DIAGNOSIS — M47812 Spondylosis without myelopathy or radiculopathy, cervical region: Secondary | ICD-10-CM | POA: Diagnosis not present

## 2022-04-13 MED ORDER — ACETAMINOPHEN 500 MG PO TABS: 1000.0000 mg | ORAL_TABLET | Freq: Once | ORAL | Status: DC

## 2022-04-13 MED ORDER — LIDOCAINE-EPINEPHRINE (PF) 2 %-1:200000 IJ SOLN
10.0000 mL | Freq: Once | INTRAMUSCULAR | Status: AC
  Administered 2022-04-13: 10 mL
  Filled 2022-04-13: qty 20

## 2022-04-13 NOTE — ED Triage Notes (Signed)
Pt arrives to ED via [facility] with c/o fall. Pt notes he was leaving his dermatology appointment and tripped and fell on the parking lot. He notes laceration to forehead and injury to left elbow.

## 2022-04-13 NOTE — Discharge Instructions (Signed)
1.  You may take extra strength Tylenol every 6 hours for pain as needed. 2.  For facial swelling and your broken nose, try to sleep at about a 30 degree elevation.  This helps with swelling.  You may also place frozen vegetables over your areas of facial swelling. 3.  You should get the stitches out in your forehead and nose in about 7 to 10 days.  Leave the dressing in place for the next 2 days.  If you are bathing and it gets wet you may remove and start putting antibiotic ointment on your abrasions. 4.  Return to the emergency department if you have any new worsening or concerning symptoms.

## 2022-04-13 NOTE — ED Provider Notes (Signed)
[facility] Provider Note   CSN: [id number] Arrival date & time: 04/13/22  1712     History  Chief Complaint  Patient presents with   Brian Bolton is a 89 y.o. male.  HPI Patient reports he was walking out of his dermatology appointment and he tripped on a curb.  He fell on a rough concrete surface and got a laceration and bruising to his forehead and elbow.  Loss of consciousness.  Patient is not anticoagulated.  Denies any significant pain complaints.    Home Medications Prior to Admission medications   Medication Sig Start Date End Date Taking? Authorizing Provider  Ascorbic Acid (VITAMIN C) 1000 MG tablet 1 tablet    [provider]  augmented betamethasone dipropionate (DIPROLENE-AF) 0.05 % cream Apply topically. 01/07/22   [provider]  cimetidine (TAGAMET) 300 MG tablet Take 300 mg by mouth 3 (three) times daily. 08/24/21   [provider]  Ferrous Gluconate-C-Folic Acid (IRON-C PO) Take 65 mg by mouth daily with breakfast.    [provider]  hydrochlorothiazide (HYDRODIURIL) 12.5 MG tablet Take 12.5 mg by mouth every morning. 07/22/21   [provider]  losartan (COZAAR) 100 MG tablet Take 100 mg by mouth daily. 07/22/21   [provider]  Magnesium 250 MG TABS 1 tablet with a meal    [provider]  rosuvastatin (CRESTOR) 10 MG tablet Take 1 tablet (10 mg total) by mouth daily. 09/04/21   Angelia Mould, MD  triamcinolone cream (KENALOG) 0.1 % SMARTSIG:1 Application Topical 2-3 Times Daily 09/03/21   [provider]  vitamin B-12 (CYANOCOBALAMIN) 1000 MCG tablet 1 tablet    [provider]      Allergies    Amlodipine besylate, Clindamycin hcl, Penicillins, and Sulfa antibiotics    Review of Systems   Review of Systems  Physical Exam Updated Vital Signs BP (!) 168/61   Pulse 95   Temp 98.1 F (36.7 C)   Resp 17   Ht 5' 10"$   (1.778 m)   Wt 72.6 kg   SpO2 99%   BMI 22.96 kg/m  Physical Exam Constitutional:      Comments: Alert with clear mental status no respiratory distress  HENT:     Head:     Comments: Laceration to the left forehead 4 cm with active bleeding.  Multiple deep abrasions to the said area on the forehead.    Nose:     Comments: Swelling of the bridge of the nose.  Laceration approximately 5 mm with active bleeding no active bleeding from the nasal passage. Eyes:     Extraocular Movements: Extraocular movements intact.     Pupils: Pupils are equal, round, and reactive to light.  Neck:     Comments: Focal midline tenderness around C4. Cardiovascular:     Rate and Rhythm: Normal rate and regular rhythm.     Heart sounds: Murmur heard.  Pulmonary:     Effort: Pulmonary effort is normal.     Breath sounds: Normal breath sounds.  Chest:     Chest wall: No tenderness.  Abdominal:     General: There is no distension.     Palpations: Abdomen is soft.     Tenderness: There is no abdominal tenderness.  Musculoskeletal:        General: Normal range of motion.     Comments: Superficial abrasions to both knees.  Skin:    General: Skin  is warm and dry.  Neurological:     General: No focal deficit present.     Mental Status: He is oriented to person, place, and time.     Motor: No weakness.     Coordination: Coordination normal.  Psychiatric:        Mood and Affect: Mood normal.     ED Results / Procedures / Treatments   Labs (all labs ordered are listed, but only abnormal results are displayed) Labs Reviewed - No data to display  EKG None  Radiology CT Maxillofacial WO CM  Result Date: 04/13/2022 CLINICAL DATA:  Status post fall. EXAM: CT MAXILLOFACIAL WITHOUT CONTRAST TECHNIQUE: Multidetector CT imaging of the maxillofacial structures was performed. Multiplanar CT image reconstructions were also generated. RADIATION DOSE REDUCTION: This exam was performed according to the  departmental dose-optimization program which includes automated exposure control, adjustment of the mA and/or kV according to patient size and/or use of iterative reconstruction technique. COMPARISON:  None Available. FINDINGS: Osseous: A small mildly angulated anterior left-sided nasal bone fracture is seen. This is of indeterminate age. Orbits: Negative. No traumatic or inflammatory finding. Sinuses: Mild bilateral maxillary sinus and mild bilateral ethmoid sinus mucosal thickening is seen. Soft tissues: There is moderate severity left frontal scalp soft tissue swelling. Limited intracranial: No significant or unexpected finding. IMPRESSION: 1. Small mildly angulated anterior left-sided nasal bone fracture, of indeterminate age. 2. Moderate severity left frontal scalp soft tissue swelling. 3. Mild bilateral maxillary sinus and mild bilateral ethmoid sinus disease. Electronically Signed   By: Virgina Norfolk M.D.   On: 04/13/2022 19:49   CT Cervical Spine Wo Contrast  Result Date: 04/13/2022 CLINICAL DATA:  Status post fall. EXAM: CT CERVICAL SPINE WITHOUT CONTRAST TECHNIQUE: Multidetector CT imaging of the cervical spine was performed without intravenous contrast. Multiplanar CT image reconstructions were also generated. RADIATION DOSE REDUCTION: This exam was performed according to the departmental dose-optimization program which includes automated exposure control, adjustment of the mA and/or kV according to patient size and/or use of iterative reconstruction technique. COMPARISON:  None Available. FINDINGS: Alignment: Normal. Skull base and vertebrae: No acute fracture. No primary bone lesion or focal pathologic process. Soft tissues and spinal canal: No prevertebral fluid or swelling. No visible canal hematoma. Disc levels: Marked severity endplate sclerosis, marked severity calcification of the anterior longitudinal ligament, marked severity anterior osteophyte formation and moderate severity posterior  bony spurring are seen throughout all levels of the cervical spine. This is most prominent at the levels of C5-C6, C6-C7, C7-T1. There is marked severity narrowing of the anterior atlantoaxial articulation. Marked severity intervertebral disc space narrowing is seen at C6-C7 and C7-T1, with mild to moderate severity intervertebral disc space narrowing noted throughout the remainder of the cervical spine. Bilateral marked severity multilevel facet joint hypertrophy is noted. Upper chest: Negative. Other: None. IMPRESSION: 1. No acute fracture or subluxation in the cervical spine. 2. Marked severity multilevel degenerative changes, as described above. Electronically Signed   By: Virgina Norfolk M.D.   On: 04/13/2022 19:46   CT Head Wo Contrast  Result Date: 04/13/2022 CLINICAL DATA:  Status post fall. EXAM: CT HEAD WITHOUT CONTRAST TECHNIQUE: Contiguous axial images were obtained from the base of the skull through the vertex without intravenous contrast. RADIATION DOSE REDUCTION: This exam was performed according to the departmental dose-optimization program which includes automated exposure control, adjustment of the mA and/or kV according to patient size and/or use of iterative reconstruction technique. COMPARISON:  None Available. FINDINGS: Brain: There is  mild cerebral atrophy with widening of the extra-axial spaces and ventricular dilatation. There are areas of decreased attenuation within the white matter tracts of the supratentorial brain, consistent with microvascular disease changes. Vascular: There is marked severity calcification of the bilateral cavernous carotid arteries. Skull: Normal. Negative for fracture or focal lesion. Sinuses/Orbits: Mild bilateral maxillary sinus and mild bilateral ethmoid sinus mucosal thickening is seen. Other: There is moderate severity left frontal scalp soft tissue swelling. IMPRESSION: 1. Moderate severity left frontal scalp soft tissue swelling without evidence for an  acute fracture or acute intracranial abnormality. 2. Generalized cerebral atrophy with chronic white matter small vessel ischemic changes. 3. Mild bilateral maxillary sinus and mild bilateral ethmoid sinus disease. Electronically Signed   By: Virgina Norfolk M.D.   On: 04/13/2022 19:43    Procedures .Marland KitchenLaceration Repair  Date/Time: 04/13/2022 11:16 PM  Performed by: Charlesetta Shanks, MD Authorized by: Charlesetta Shanks, MD   Consent:    Consent obtained:  Verbal   Consent given by:  Patient   Risks discussed:  Infection, pain and poor wound healing Laceration details:    Location:  Face   Face location:  Forehead   Length (cm):  4   Depth (mm):  3 Pre-procedure details:    Preparation:  Patient was prepped and draped in usual sterile fashion Exploration:    Hemostasis achieved with:  Epinephrine and direct pressure   Imaging outcome: foreign body not noted   Treatment:    Area cleansed with:  Saline and Shur-Clens   Amount of cleaning:  Standard Skin repair:    Repair method:  Sutures   Suture size:  5-0   Suture material:  Nylon   Suture technique:  Simple interrupted   Number of sutures:  4 Approximation:    Approximation:  Close Repair type:    Repair type:  Intermediate Post-procedure details:    Dressing:  Bulky dressing   Procedure completion:  Tolerated well, no immediate complications   Additional small puncture like laceration closed just above eyebrow with 1 suture Additional small flap on the nasal bridge closed with 1 suture.   Medications Ordered in ED Medications  acetaminophen (TYLENOL) tablet 1,000 mg (has no administration in time range)  lidocaine-EPINEPHrine (XYLOCAINE W/EPI) 2 %-1:200000 (PF) injection 10 mL (10 mLs Infiltration Given 04/13/22 2227)    ED Course/ Medical Decision Making/ A&P                             Medical Decision Making Amount and/or Complexity of Data Reviewed Radiology: ordered.  Risk OTC drugs. Prescription drug  management.   Patient mechanical fall.  He has multiple deep abrasions and some laceration to the forehead and nose.  Will proceed with CT scan.  Patient is not anticoagulated.  Also some reproducible midline pain with age and mechanism will also proceed with CT scan of the C-spine.  No reproducible pain of the extremities or chest wall.  CT scan interpreted by radiology, no intracranial bleed.  Positive for small nasal bone fracture.  CT C-spine no acute findings.  Lacerations cleaned and repaired.  Patient did have multiple deep abrasions with some persistent oozing of blood.  Combat gauze applied and dressing over the forehead.  Bleeding controlled.  Patient is at baseline mental status.  No signs of intracranial injury.  Counseled on management of nasal fracture and facial lacerations.  Family member at bedside with all diagnostic results and planning reviewed.  Final Clinical Impression(s) / ED Diagnoses Final diagnoses:  Fall, initial encounter  Closed fracture of nasal bone, initial encounter  Facial laceration, initial encounter    Rx / DC Orders ED Discharge Orders     None         Charlesetta Shanks, MD 04/13/22 2321

## 2022-04-24 DIAGNOSIS — S0181XD Laceration without foreign body of other part of head, subsequent encounter: Secondary | ICD-10-CM | POA: Diagnosis not present

## 2022-04-24 DIAGNOSIS — Z4802 Encounter for removal of sutures: Secondary | ICD-10-CM | POA: Diagnosis not present

## 2022-06-05 DIAGNOSIS — L109 Pemphigus, unspecified: Secondary | ICD-10-CM | POA: Diagnosis not present

## 2022-06-05 DIAGNOSIS — R7303 Prediabetes: Secondary | ICD-10-CM | POA: Diagnosis not present

## 2022-06-05 DIAGNOSIS — E7800 Pure hypercholesterolemia, unspecified: Secondary | ICD-10-CM | POA: Diagnosis not present

## 2022-06-05 DIAGNOSIS — R7309 Other abnormal glucose: Secondary | ICD-10-CM | POA: Diagnosis not present

## 2022-06-05 DIAGNOSIS — Z0000 Encounter for general adult medical examination without abnormal findings: Secondary | ICD-10-CM | POA: Diagnosis not present

## 2022-06-05 DIAGNOSIS — I10 Essential (primary) hypertension: Secondary | ICD-10-CM | POA: Diagnosis not present

## 2022-06-05 DIAGNOSIS — E039 Hypothyroidism, unspecified: Secondary | ICD-10-CM | POA: Diagnosis not present

## 2022-06-18 DIAGNOSIS — Z85828 Personal history of other malignant neoplasm of skin: Secondary | ICD-10-CM | POA: Diagnosis not present

## 2022-06-18 DIAGNOSIS — L120 Bullous pemphigoid: Secondary | ICD-10-CM | POA: Diagnosis not present

## 2022-06-18 DIAGNOSIS — Z08 Encounter for follow-up examination after completed treatment for malignant neoplasm: Secondary | ICD-10-CM | POA: Diagnosis not present

## 2022-08-24 ENCOUNTER — Other Ambulatory Visit: Payer: Self-pay | Admitting: Vascular Surgery

## 2022-09-10 DIAGNOSIS — L120 Bullous pemphigoid: Secondary | ICD-10-CM | POA: Diagnosis not present

## 2022-09-10 DIAGNOSIS — Z1283 Encounter for screening for malignant neoplasm of skin: Secondary | ICD-10-CM | POA: Diagnosis not present

## 2022-09-10 DIAGNOSIS — L570 Actinic keratosis: Secondary | ICD-10-CM | POA: Diagnosis not present

## 2022-09-10 DIAGNOSIS — C44219 Basal cell carcinoma of skin of left ear and external auricular canal: Secondary | ICD-10-CM | POA: Diagnosis not present

## 2022-09-10 DIAGNOSIS — X32XXXD Exposure to sunlight, subsequent encounter: Secondary | ICD-10-CM | POA: Diagnosis not present

## 2022-09-21 ENCOUNTER — Other Ambulatory Visit: Payer: Self-pay | Admitting: Vascular Surgery

## 2022-09-24 ENCOUNTER — Other Ambulatory Visit: Payer: Self-pay

## 2022-09-24 MED ORDER — ROSUVASTATIN CALCIUM 10 MG PO TABS: 10.0000 mg | ORAL_TABLET | Freq: Every day | ORAL | 5 refills | Status: DC

## 2022-11-26 DIAGNOSIS — Z85828 Personal history of other malignant neoplasm of skin: Secondary | ICD-10-CM | POA: Diagnosis not present

## 2022-11-26 DIAGNOSIS — Z08 Encounter for follow-up examination after completed treatment for malignant neoplasm: Secondary | ICD-10-CM | POA: Diagnosis not present

## 2022-11-26 DIAGNOSIS — L820 Inflamed seborrheic keratosis: Secondary | ICD-10-CM | POA: Diagnosis not present

## 2022-12-07 DIAGNOSIS — I10 Essential (primary) hypertension: Secondary | ICD-10-CM | POA: Diagnosis not present

## 2022-12-07 DIAGNOSIS — E039 Hypothyroidism, unspecified: Secondary | ICD-10-CM | POA: Diagnosis not present

## 2022-12-07 DIAGNOSIS — Z23 Encounter for immunization: Secondary | ICD-10-CM | POA: Diagnosis not present

## 2022-12-07 DIAGNOSIS — D649 Anemia, unspecified: Secondary | ICD-10-CM | POA: Diagnosis not present

## 2022-12-07 DIAGNOSIS — E119 Type 2 diabetes mellitus without complications: Secondary | ICD-10-CM | POA: Diagnosis not present

## 2022-12-07 DIAGNOSIS — E7800 Pure hypercholesterolemia, unspecified: Secondary | ICD-10-CM | POA: Diagnosis not present

## 2022-12-07 DIAGNOSIS — L109 Pemphigus, unspecified: Secondary | ICD-10-CM | POA: Diagnosis not present

## 2023-02-11 DIAGNOSIS — L570 Actinic keratosis: Secondary | ICD-10-CM | POA: Diagnosis not present

## 2023-02-11 DIAGNOSIS — Z85820 Personal history of malignant melanoma of skin: Secondary | ICD-10-CM | POA: Diagnosis not present

## 2023-02-11 DIAGNOSIS — L120 Bullous pemphigoid: Secondary | ICD-10-CM | POA: Diagnosis not present

## 2023-02-11 DIAGNOSIS — D225 Melanocytic nevi of trunk: Secondary | ICD-10-CM | POA: Diagnosis not present

## 2023-02-11 DIAGNOSIS — Z08 Encounter for follow-up examination after completed treatment for malignant neoplasm: Secondary | ICD-10-CM | POA: Diagnosis not present

## 2023-02-11 DIAGNOSIS — Z1283 Encounter for screening for malignant neoplasm of skin: Secondary | ICD-10-CM | POA: Diagnosis not present

## 2023-02-11 DIAGNOSIS — X32XXXD Exposure to sunlight, subsequent encounter: Secondary | ICD-10-CM | POA: Diagnosis not present

## 2023-03-01 ENCOUNTER — Other Ambulatory Visit: Payer: Self-pay | Admitting: *Deleted

## 2023-03-01 DIAGNOSIS — I6523 Occlusion and stenosis of bilateral carotid arteries: Secondary | ICD-10-CM

## 2023-03-08 ENCOUNTER — Ambulatory Visit (HOSPITAL_COMMUNITY): Payer: PPO

## 2023-03-08 ENCOUNTER — Ambulatory Visit: Payer: PPO

## 2023-03-18 ENCOUNTER — Telehealth: Payer: Self-pay

## 2023-03-18 NOTE — Telephone Encounter (Signed)
Pt requesting refill on medication.  On chart review, it is noted medication was prescribed >1 year and was requested to be managed by PCP due lab monitoring.   PCP office called today and is to be discussed with MD.

## 2023-05-06 ENCOUNTER — Ambulatory Visit (HOSPITAL_COMMUNITY)
Admission: RE | Admit: 2023-05-06 | Discharge: 2023-05-06 | Disposition: A | Discharge status: Home / Self Care | Payer: PPO | Source: Ambulatory Visit | Attending: Physician Assistant | Admitting: Physician Assistant

## 2023-05-06 ENCOUNTER — Ambulatory Visit: Payer: PPO | Admitting: Physician Assistant

## 2023-05-06 VITALS — BP 152/72 | HR 86 | Temp 98.5°F | Resp 20 | Ht 70.0 in | Wt 167.3 lb

## 2023-05-06 DIAGNOSIS — I6523 Occlusion and stenosis of bilateral carotid arteries: Secondary | ICD-10-CM | POA: Diagnosis not present

## 2023-05-06 MED ORDER — ROSUVASTATIN CALCIUM 10 MG PO TABS: 10.0000 mg | ORAL_TABLET | Freq: Every day | ORAL | 1 refills | Status: AC

## 2023-05-06 NOTE — Progress Notes (Signed)
 HISTORY AND PHYSICAL     CC:  follow up. Requesting Provider:  Noberto Retort, MD  HPI: This is a 89 y.o. male here for follow up for carotid artery stenosis.  He was originally seen by Dr. 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  This prompted a duplex scan which showed a 50 to 69% right carotid stenosis and a less than 39% left carotid stenosis.  Given that there was no significant stenosis on the left he did not recommend further workup at that time.    Pt was last seen 03/05/2022 and at that time he was asymptomatic with 40-59% right ICA stenosis.    Pt returns today for follow up and here with his son.    Pt denies any amaurosis fugax, speech difficulties, weakness, numbness, paralysis or clumsiness or facial droop.  He is compliant with his statin and asa.   He states that he buried his wife of 64 years (+ 5 years of dating prior) 2 weeks ago today and he had been caring for her full time.  He is going to start walking again.    He denies any claudication or rest pain.  He states he does gets some cramps at rest.     The pt is on a statin for cholesterol management.  The pt is on a daily aspirin.   Other AC:  none The pt is on diuretic, ARB for hypertension.   The pt is not on medication for diabetes Tobacco hx:  never    Past Medical History:  Diagnosis Date   Cancer (HCC)    skin on face   Hypertension     Past Surgical History:  Procedure Laterality Date   SKIN CANCER EXCISION     facial   TONSILLECTOMY     TOOTH EXTRACTION      Allergies  Allergen Reactions   Amlodipine Besylate     Other reaction(s): rash   Clindamycin Hcl     Other reaction(s): swelling   Penicillins     Other reaction(s): Unknown   Sulfa Antibiotics     Other reaction(s): Unknown    Current Outpatient Medications  Medication Sig Dispense Refill   Ascorbic Acid (VITAMIN C) 1000 MG tablet 1 tablet     augmented betamethasone dipropionate  (DIPROLENE-AF) 0.05 % cream Apply topically.     cimetidine (TAGAMET) 300 MG tablet Take 300 mg by mouth 3 (three) times daily.     Ferrous Gluconate-C-Folic Acid (IRON-C PO) Take 65 mg by mouth daily with breakfast.     hydrochlorothiazide (HYDRODIURIL) 12.5 MG tablet Take 12.5 mg by mouth every morning.     losartan (COZAAR) 100 MG tablet Take 100 mg by mouth daily.     Magnesium 250 MG TABS 1 tablet with a meal     rosuvastatin (CRESTOR) 10 MG tablet Take 1 tablet (10 mg total) by mouth daily. 30 tablet 5   triamcinolone cream (KENALOG) 0.1 % SMARTSIG:1 Application Topical 2-3 Times Daily     vitamin B-12 (CYANOCOBALAMIN) 1000 MCG tablet 1 tablet     No current facility-administered medications for this visit.    No family history on file.  Social History   Socioeconomic History   Marital status: Unknown    Spouse name: Not on file   Number of children: Not on file   Years of education: Not on file   Highest education level: Not on file  Occupational History  Not on file  Tobacco Use   Smoking status: Never   Smokeless tobacco: Never  Substance and Sexual Activity   Alcohol use: Never   Drug use: Not on file   Sexual activity: Not on file  Other Topics Concern   Not on file  Social History Narrative   Not on file   Social Drivers of Health   Financial Resource Strain: Not on file  Food Insecurity: Not on file  Transportation Needs: Not on file  Physical Activity: Not on file  Stress: Not on file  Social Connections: Not on file  Intimate Partner Violence: Not on file     REVIEW OF SYSTEMS:   [X]  denotes positive finding, [ ]  denotes negative finding Cardiac  Comments:  Chest pain or chest pressure:    Shortness of breath upon exertion:    Short of breath when lying flat:    Irregular heart rhythm:        Vascular    Pain in calf, thigh, or hip brought on by ambulation:    Pain in feet at night that wakes you up from your sleep:     Blood clot in your  veins:    Leg swelling:         Pulmonary    Oxygen at home:    Productive cough:     Wheezing:         Neurologic    Sudden weakness in arms or legs:     Sudden numbness in arms or legs:     Sudden onset of difficulty speaking or slurred speech:    Temporary loss of vision in one eye:     Problems with dizziness:         Gastrointestinal    Blood in stool:     Vomited blood:         Genitourinary    Burning when urinating:     Blood in urine:        Psychiatric    Major depression:         Hematologic    Bleeding problems:    Problems with blood clotting too easily:        Skin    Rashes or ulcers:        Constitutional    Fever or chills:      PHYSICAL EXAMINATION:  Today's Vitals   05/06/23 1422 05/06/23 1426  BP: (!) 152/67 (!) 152/72  Pulse: 86   Resp: 20   Temp: 98.5 F (36.9 C)   TempSrc: Temporal   SpO2: 100%   Weight: 167 lb 4.8 oz (75.9 kg)   Height: 5\' 10"  (1.778 m)    Body mass index is 24.01 kg/m.   General:  WDWN in NAD; vital signs documented above Gait: Not observed HENT: WNL, normocephalic Pulmonary: normal non-labored breathing Cardiac: regular HR, without carotid bruits Skin: without rashes Vascular Exam/Pulses:  Right Left  Radial 2+ (normal) 2+ (normal)   Extremities: without open wounds Musculoskeletal: no muscle wasting or atrophy  Neurologic: A&O X 3; moving all extremities equally; speech is fluent/normal Psychiatric:  The pt has Normal affect.   Non-Invasive Vascular Imaging:   Carotid Duplex on 05/06/2023 Right:  40-59% ICA stenosis Left:  1-39% ICA stenosis Vertebrals:  Bilateral vertebral arteries demonstrate antegrade flow.  Subclavians: Normal flow hemodynamics were seen in bilateral subclavian arteries.   Previous Carotid duplex on 03/05/2022: Right: 40-59% ICA stenosis Left:   1-39% ICA stenosis    ASSESSMENT/PLAN::  89 y.o. male here for follow up carotid artery stenosis  -duplex today reveals right ICA  stenosis of 40-59% and left ICA stenosis of 1-39%, which is unchanged.   -discussed s/s of stroke with pt and he understands should he develop any of these sx, he will go to the nearest ER or call 911. -pt will f/u in one year with carotid duplex -pt will call sooner should he have any issues. -continue statin/asa    Doreatha Massed, [facility] [phone]  Clinic MD:  Karin Lieu

## 2023-05-08 ENCOUNTER — Other Ambulatory Visit: Payer: Self-pay | Admitting: Physician Assistant

## 2023-05-20 ENCOUNTER — Other Ambulatory Visit (HOSPITAL_COMMUNITY): Payer: Self-pay

## 2023-05-20 MED ORDER — HYDROCHLOROTHIAZIDE 12.5 MG PO TABS
12.5000 mg | ORAL_TABLET | Freq: Every morning | ORAL | 1 refills | Status: DC
  Filled 2023-05-20 – 2023-07-07 (×2): qty 90, 90d supply, fill #0
  Filled 2023-10-19: qty 90, 90d supply, fill #1

## 2023-05-20 MED ORDER — LOSARTAN POTASSIUM 100 MG PO TABS
100.0000 mg | ORAL_TABLET | Freq: Every day | ORAL | 1 refills | Status: DC
  Filled 2023-05-20 – 2023-07-07 (×2): qty 90, 90d supply, fill #0
  Filled 2023-10-19: qty 90, 90d supply, fill #1

## 2023-05-20 MED ORDER — ROSUVASTATIN CALCIUM 10 MG PO TABS
10.0000 mg | ORAL_TABLET | Freq: Every day | ORAL | 0 refills | Status: DC
  Filled 2023-05-20: qty 90, 90d supply, fill #0

## 2023-05-26 ENCOUNTER — Other Ambulatory Visit (HOSPITAL_COMMUNITY): Payer: Self-pay

## 2023-05-27 ENCOUNTER — Other Ambulatory Visit: Payer: Self-pay

## 2023-05-27 ENCOUNTER — Other Ambulatory Visit (HOSPITAL_COMMUNITY): Payer: Self-pay

## 2023-05-27 MED ORDER — DOXYCYCLINE HYCLATE 100 MG PO CAPS
100.0000 mg | ORAL_CAPSULE | Freq: Two times a day (BID) | ORAL | 3 refills | Status: DC
  Filled 2023-05-27: qty 60, 30d supply, fill #0
  Filled 2023-08-05 – 2023-08-06 (×2): qty 60, 30d supply, fill #1
  Filled 2023-09-28 – 2023-10-01 (×2): qty 60, 30d supply, fill #2
  Filled 2023-12-01 – 2023-12-07 (×3): qty 60, 30d supply, fill #3

## 2023-06-15 DIAGNOSIS — L109 Pemphigus, unspecified: Secondary | ICD-10-CM | POA: Diagnosis not present

## 2023-06-15 DIAGNOSIS — E039 Hypothyroidism, unspecified: Secondary | ICD-10-CM | POA: Diagnosis not present

## 2023-06-15 DIAGNOSIS — I6523 Occlusion and stenosis of bilateral carotid arteries: Secondary | ICD-10-CM | POA: Diagnosis not present

## 2023-06-15 DIAGNOSIS — E7800 Pure hypercholesterolemia, unspecified: Secondary | ICD-10-CM | POA: Diagnosis not present

## 2023-06-15 DIAGNOSIS — Z0000 Encounter for general adult medical examination without abnormal findings: Secondary | ICD-10-CM | POA: Diagnosis not present

## 2023-06-15 DIAGNOSIS — E119 Type 2 diabetes mellitus without complications: Secondary | ICD-10-CM | POA: Diagnosis not present

## 2023-06-15 DIAGNOSIS — I10 Essential (primary) hypertension: Secondary | ICD-10-CM | POA: Diagnosis not present

## 2023-06-15 DIAGNOSIS — R8281 Pyuria: Secondary | ICD-10-CM | POA: Diagnosis not present

## 2023-06-29 ENCOUNTER — Other Ambulatory Visit (HOSPITAL_COMMUNITY): Payer: Self-pay

## 2023-06-29 ENCOUNTER — Other Ambulatory Visit: Payer: Self-pay

## 2023-06-29 MED ORDER — CIPROFLOXACIN HCL 500 MG PO TABS
500.0000 mg | ORAL_TABLET | Freq: Two times a day (BID) | ORAL | 0 refills | Status: AC
  Filled 2023-06-29: qty 14, 7d supply, fill #0

## 2023-07-07 ENCOUNTER — Other Ambulatory Visit: Payer: Self-pay

## 2023-07-07 ENCOUNTER — Other Ambulatory Visit (HOSPITAL_COMMUNITY): Payer: Self-pay

## 2023-08-05 ENCOUNTER — Other Ambulatory Visit: Payer: Self-pay

## 2023-08-06 ENCOUNTER — Other Ambulatory Visit (HOSPITAL_COMMUNITY): Payer: Self-pay

## 2023-08-06 ENCOUNTER — Other Ambulatory Visit: Payer: Self-pay

## 2023-09-07 DIAGNOSIS — B351 Tinea unguium: Secondary | ICD-10-CM | POA: Diagnosis not present

## 2023-09-07 DIAGNOSIS — L03116 Cellulitis of left lower limb: Secondary | ICD-10-CM | POA: Diagnosis not present

## 2023-09-16 DIAGNOSIS — L570 Actinic keratosis: Secondary | ICD-10-CM | POA: Diagnosis not present

## 2023-09-16 DIAGNOSIS — X32XXXD Exposure to sunlight, subsequent encounter: Secondary | ICD-10-CM | POA: Diagnosis not present

## 2023-09-22 ENCOUNTER — Encounter: Payer: Self-pay | Admitting: Podiatry

## 2023-09-22 ENCOUNTER — Other Ambulatory Visit (HOSPITAL_COMMUNITY): Payer: Self-pay

## 2023-09-22 ENCOUNTER — Ambulatory Visit: Admitting: Podiatry

## 2023-09-22 DIAGNOSIS — M7662 Achilles tendinitis, left leg: Secondary | ICD-10-CM

## 2023-09-22 DIAGNOSIS — B351 Tinea unguium: Secondary | ICD-10-CM | POA: Diagnosis not present

## 2023-09-22 DIAGNOSIS — M79676 Pain in unspecified toe(s): Secondary | ICD-10-CM | POA: Diagnosis not present

## 2023-09-22 NOTE — Progress Notes (Signed)
 Subjective:   Patient ID: Brian Bolton, male   DOB: 89 y.o.   MRN: [id number]   HPI Patient presents with elongated nailbeds 1-5 both feet that are hard for him to cut and also concerned about his Achilles left stating it is feeling some better he wanted it checked.  Patient does not smoke presents with caregiver   Review of Systems  All other systems reviewed and are negative.       Objective:  Physical Exam Vitals and nursing note reviewed.  Constitutional:      Appearance: He is well-developed.  Pulmonary:     Effort: Pulmonary effort is normal.  Musculoskeletal:        General: Normal range of motion.  Skin:    General: Skin is warm.  Neurological:     Mental Status: He is alert.     Neurovascular status found to be intact mild reduction of pulses bilateral but patient does walk and seems to be active.  Patient is found to have thick yellow brittle nailbeds 1-5 both feet dystrophic and impossible for the patient to chronic with good digital perfusion     Assessment:  Chronic mycotic nail infection 1-5 both feet moderate vascular disease and mild Achilles tendinitis left     Plan:  H&P done Achilles tendon discussed with stretching exercises debrided nailbeds 1-5 both feet no iatrogenic bleeding and reappoint point as symptoms indicate

## 2023-09-23 ENCOUNTER — Other Ambulatory Visit (HOSPITAL_COMMUNITY): Payer: Self-pay

## 2023-09-23 ENCOUNTER — Other Ambulatory Visit: Payer: Self-pay

## 2023-09-23 MED ORDER — ROSUVASTATIN CALCIUM 10 MG PO TABS
10.0000 mg | ORAL_TABLET | Freq: Every day | ORAL | 1 refills | Status: DC
  Filled 2023-09-23: qty 90, 90d supply, fill #0
  Filled 2023-12-15: qty 90, 90d supply, fill #1

## 2023-09-27 ENCOUNTER — Other Ambulatory Visit (HOSPITAL_COMMUNITY): Payer: Self-pay

## 2023-09-28 ENCOUNTER — Other Ambulatory Visit (HOSPITAL_COMMUNITY): Payer: Self-pay

## 2023-10-01 ENCOUNTER — Other Ambulatory Visit: Payer: Self-pay

## 2023-10-01 ENCOUNTER — Other Ambulatory Visit (HOSPITAL_COMMUNITY): Payer: Self-pay

## 2023-10-06 ENCOUNTER — Other Ambulatory Visit: Payer: Self-pay

## 2023-10-06 ENCOUNTER — Other Ambulatory Visit (HOSPITAL_COMMUNITY): Payer: Self-pay

## 2023-10-06 MED ORDER — BETAMETHASONE DIPROPIONATE AUG 0.05 % EX CREA
TOPICAL_CREAM | CUTANEOUS | 1 refills | Status: DC
  Filled 2023-10-06: qty 60, 30d supply, fill #0
  Filled 2023-11-29: qty 60, 30d supply, fill #1

## 2023-10-07 ENCOUNTER — Other Ambulatory Visit (HOSPITAL_COMMUNITY): Payer: Self-pay

## 2023-10-07 IMAGING — US US CAROTID DUPLEX BILAT
1 series · 14 of 24 positions shown · non-contrast
Comparison: None available

CLINICAL DATA: Hollenhorst plaque of left eye

Hypertension
EXAM:
BILATERAL CAROTID DUPLEX ULTRASOUND
TECHNIQUE: Gray scale imaging, color Doppler and duplex ultrasound were
performed of bilateral carotid and vertebral arteries in the neck.

[Series 1: us carotid duplex bilat · 0.06mm/px · 14 of 62 slices shown]
[im 1/62]
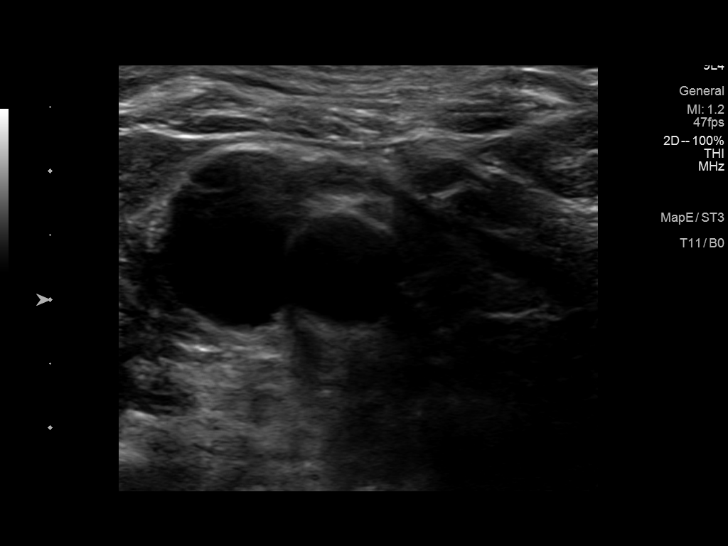
[im 6/62]
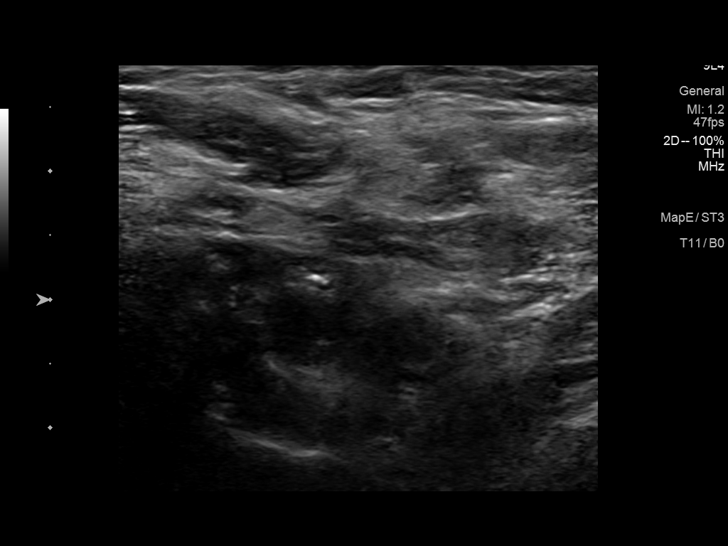
[im 11/62]
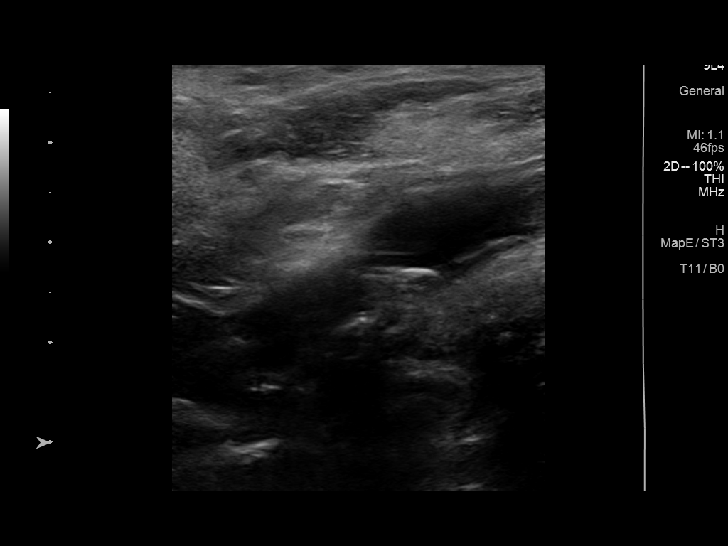
[im 16/62]
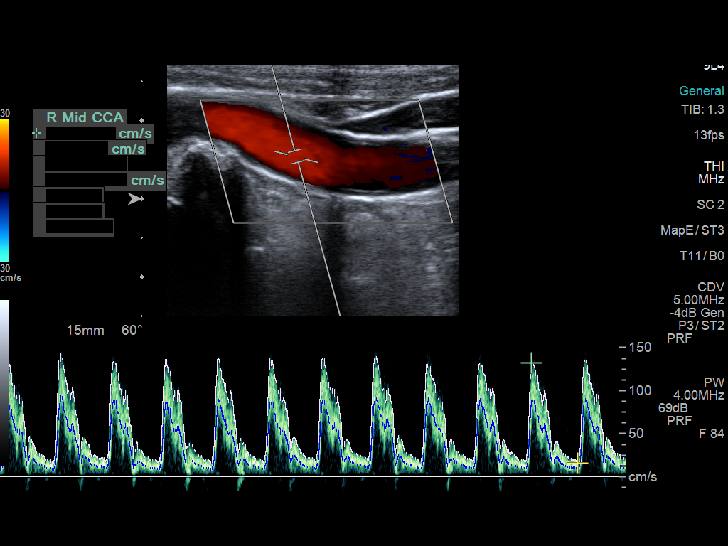
[im 19/62]
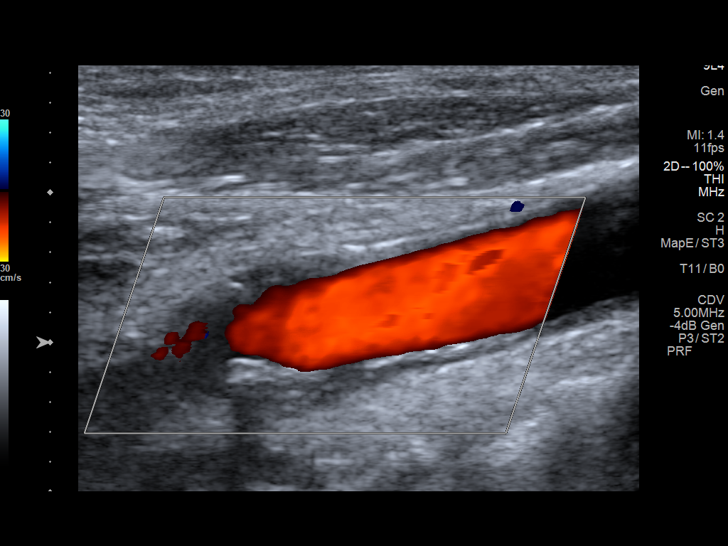
[im 24/62]
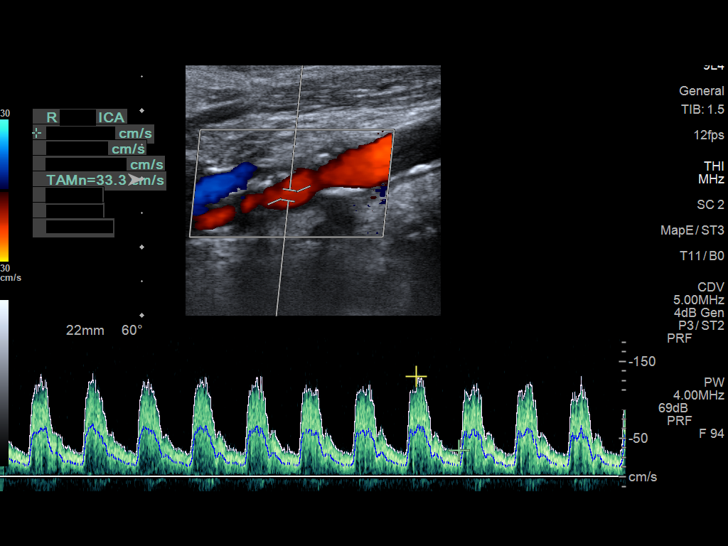
[im 30/62]
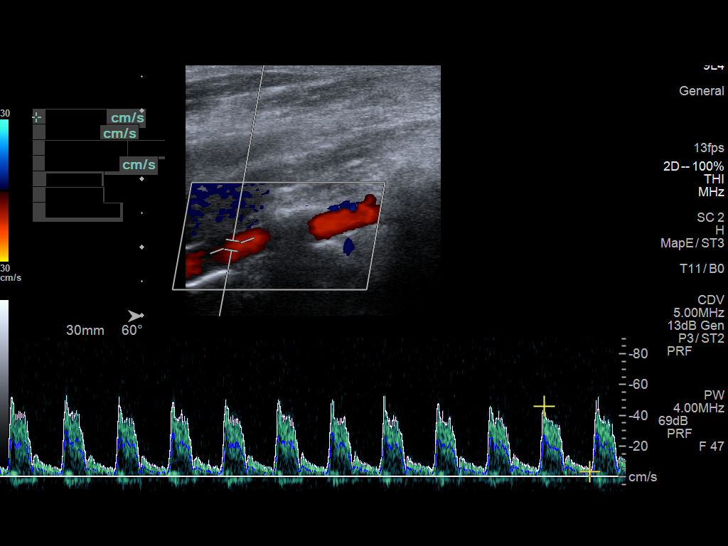
[im 32/62]
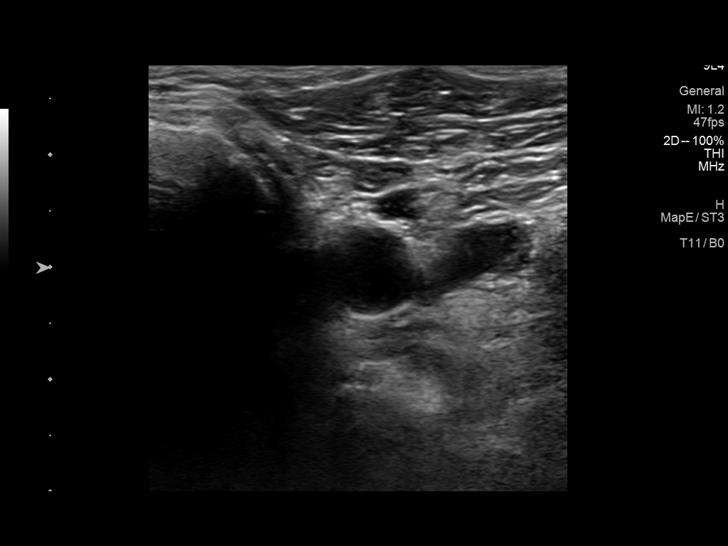
[im 38/62]
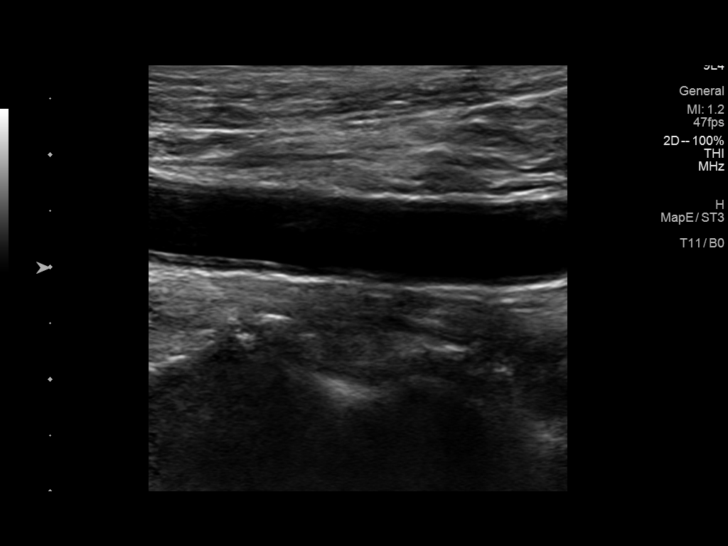
[im 43/62]
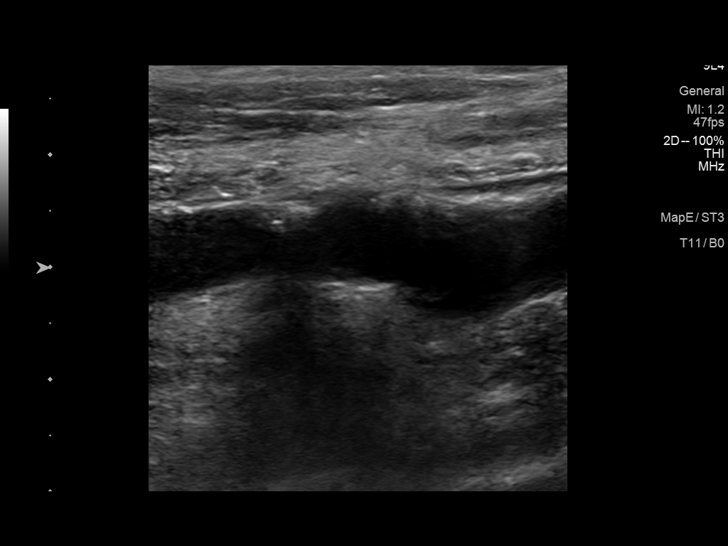
[im 48/62]
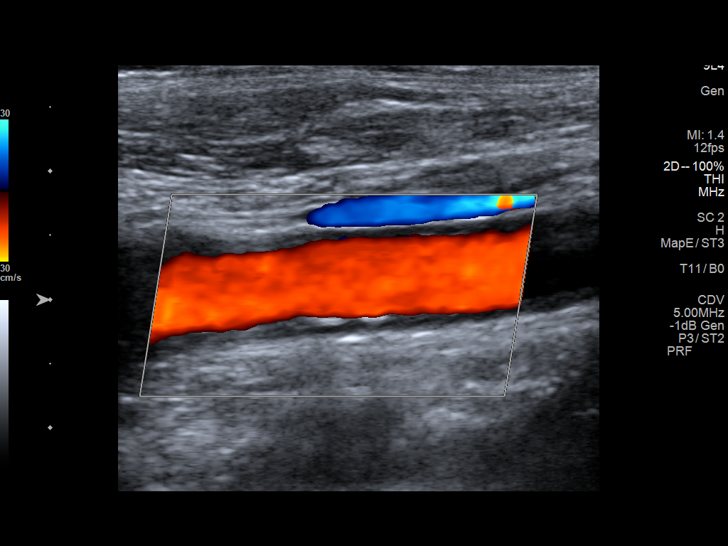
[im 51/62]
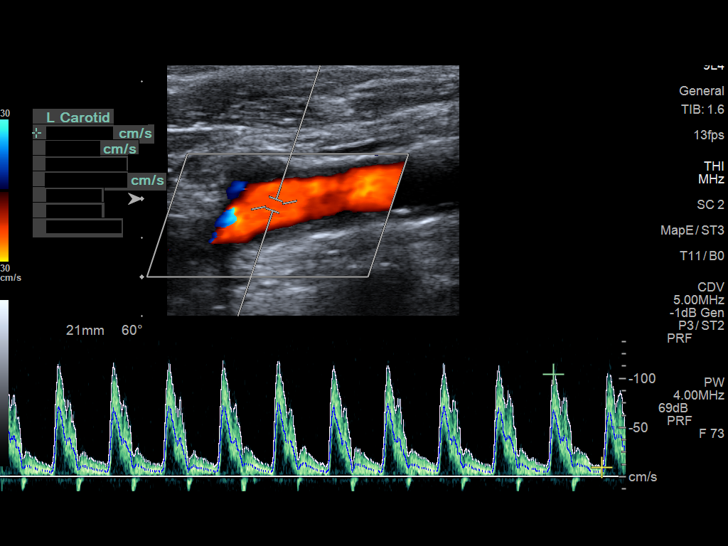
[im 56/62]
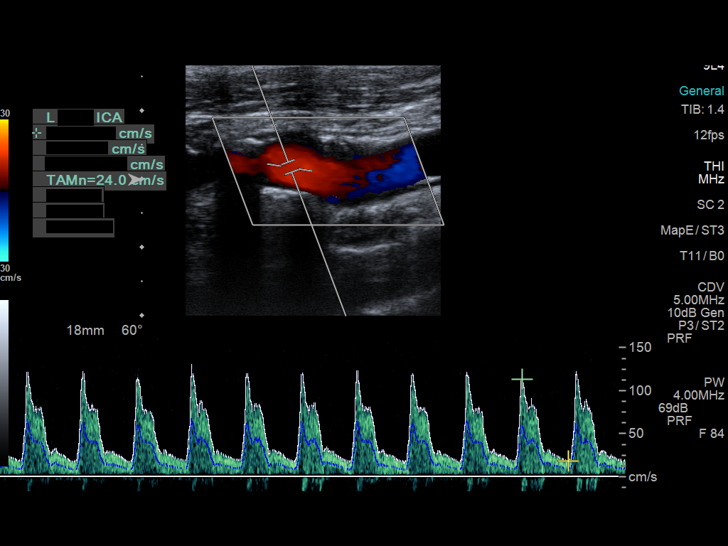
[im 62/62]
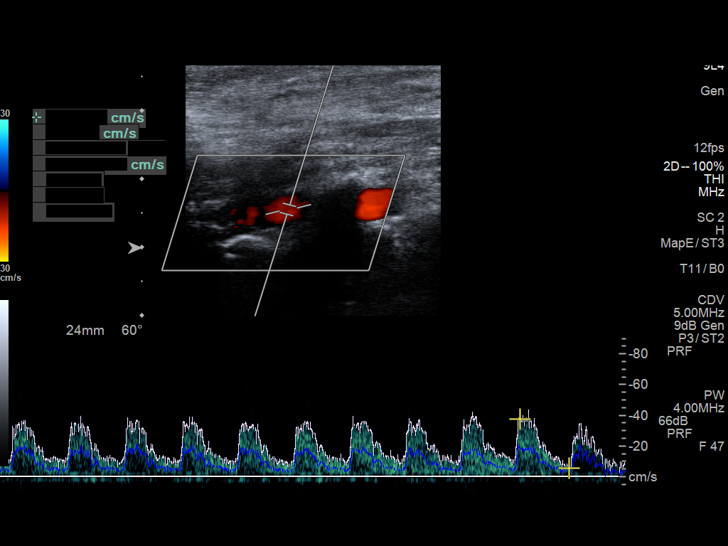

[14 of 24 positions shown; findings below may reference images not displayed]

FINDINGS: Criteria: Quantification of carotid stenosis is based on velocity
parameters that correlate the residual internal carotid diameter
with NASCET-based stenosis levels, using the diameter of the distal
internal carotid lumen as the denominator for stenosis measurement.

The following velocity measurements were obtained:

RIGHT

ICA: 221/67 cm/sec

CCA: 132/60 cm/sec

SYSTOLIC ICA/CCA RATIO:

ECA: 146 cm/sec

LEFT

ICA: 113/18 cm/sec

CCA: 141/22 cm/sec

SYSTOLIC ICA/CCA RATIO:

ECA: 154 cm/sec

RIGHT CAROTID ARTERY: Moderate heterogeneous plaque of the carotid
bifurcation.

RIGHT VERTEBRAL ARTERY:  Antegrade flow.

LEFT CAROTID ARTERY: Mild heterogeneous plaque of the left carotid
bifurcation.

LEFT VERTEBRAL ARTERY:  Antegrade flow.
IMPRESSION: 1. 50-69% stenosis of the right internal carotid artery.
2. Less than 50% stenosis of the left internal carotid artery.

## 2023-10-07 MED ORDER — FLUTICASONE PROPIONATE 0.05 % EX CREA
TOPICAL_CREAM | Freq: Two times a day (BID) | CUTANEOUS | 3 refills | Status: AC | PRN
  Filled 2023-10-07: qty 60, 30d supply, fill #0

## 2023-10-19 ENCOUNTER — Other Ambulatory Visit (HOSPITAL_COMMUNITY): Payer: Self-pay

## 2023-11-29 ENCOUNTER — Other Ambulatory Visit: Payer: Self-pay

## 2023-11-29 ENCOUNTER — Other Ambulatory Visit (HOSPITAL_COMMUNITY): Payer: Self-pay

## 2023-12-01 ENCOUNTER — Other Ambulatory Visit (HOSPITAL_COMMUNITY): Payer: Self-pay

## 2023-12-01 ENCOUNTER — Other Ambulatory Visit: Payer: Self-pay

## 2023-12-04 ENCOUNTER — Other Ambulatory Visit (HOSPITAL_COMMUNITY): Payer: Self-pay

## 2023-12-07 ENCOUNTER — Other Ambulatory Visit: Payer: Self-pay

## 2023-12-09 DIAGNOSIS — L570 Actinic keratosis: Secondary | ICD-10-CM | POA: Diagnosis not present

## 2023-12-09 DIAGNOSIS — X32XXXD Exposure to sunlight, subsequent encounter: Secondary | ICD-10-CM | POA: Diagnosis not present

## 2023-12-09 DIAGNOSIS — L120 Bullous pemphigoid: Secondary | ICD-10-CM | POA: Diagnosis not present

## 2023-12-15 ENCOUNTER — Other Ambulatory Visit (HOSPITAL_COMMUNITY): Payer: Self-pay

## 2023-12-16 ENCOUNTER — Other Ambulatory Visit (HOSPITAL_COMMUNITY): Payer: Self-pay

## 2023-12-23 ENCOUNTER — Encounter: Payer: Self-pay | Admitting: Podiatry

## 2023-12-23 ENCOUNTER — Ambulatory Visit: Admitting: Podiatry

## 2023-12-23 DIAGNOSIS — M79609 Pain in unspecified limb: Secondary | ICD-10-CM | POA: Diagnosis not present

## 2023-12-23 DIAGNOSIS — B351 Tinea unguium: Secondary | ICD-10-CM

## 2023-12-23 NOTE — Progress Notes (Signed)
 This patient presents to the office with chief complaint of long thick painful nails.  Patient says the nails are painful walking and wearing shoes.  This patient is unable to self treat.  This patient is unable to trim his  nails since he is unable to reach his  nails.  he presents to the office for preventative foot care services.  General Appearance  Alert, conversant and in no acute stress.  Vascular  Dorsalis pedis and posterior tibial  pulses are  weakly palpable  bilaterally.  Capillary return is within normal limits  bilaterally. Temperature is within normal limits  bilaterally.  Neurologic  Senn-Weinstein monofilament wire test within normal limits  bilaterally. Muscle power within normal limits bilaterally.  Nails Thick disfigured discolored nails with subungual debris  from hallux to fifth toes bilaterally. No evidence of bacterial infection or drainage bilaterally.  Orthopedic  No limitations of motion  feet .  No crepitus or effusions noted.  No bony pathology or digital deformities noted.  Skin  normotropic skin with no porokeratosis noted bilaterally.  No signs of infections or ulcers noted.     Onychomycosis  Nails  B/L.  Pain in right toes  Pain in left toes  Debridement of nails both feet followed trimming the nails with dremel tool.    RTC 3 months.   Cordella Bold DPM tomma

## 2024-01-03 ENCOUNTER — Other Ambulatory Visit (HOSPITAL_COMMUNITY): Payer: Self-pay

## 2024-01-03 ENCOUNTER — Other Ambulatory Visit: Payer: Self-pay

## 2024-01-03 MED ORDER — BETAMETHASONE DIPROPIONATE AUG 0.05 % EX CREA
TOPICAL_CREAM | CUTANEOUS | 3 refills | Status: AC
  Filled 2024-01-03: qty 60, 30d supply, fill #0
  Filled 2024-04-05: qty 60, 30d supply, fill #1

## 2024-01-04 ENCOUNTER — Other Ambulatory Visit: Payer: Self-pay

## 2024-01-12 DIAGNOSIS — H9193 Unspecified hearing loss, bilateral: Secondary | ICD-10-CM | POA: Diagnosis not present

## 2024-01-12 DIAGNOSIS — I10 Essential (primary) hypertension: Secondary | ICD-10-CM | POA: Diagnosis not present

## 2024-01-12 DIAGNOSIS — I6523 Occlusion and stenosis of bilateral carotid arteries: Secondary | ICD-10-CM | POA: Diagnosis not present

## 2024-01-12 DIAGNOSIS — E7800 Pure hypercholesterolemia, unspecified: Secondary | ICD-10-CM | POA: Diagnosis not present

## 2024-01-12 DIAGNOSIS — E039 Hypothyroidism, unspecified: Secondary | ICD-10-CM | POA: Diagnosis not present

## 2024-01-12 DIAGNOSIS — E119 Type 2 diabetes mellitus without complications: Secondary | ICD-10-CM | POA: Diagnosis not present

## 2024-01-12 DIAGNOSIS — L109 Pemphigus, unspecified: Secondary | ICD-10-CM | POA: Diagnosis not present

## 2024-01-12 DIAGNOSIS — D649 Anemia, unspecified: Secondary | ICD-10-CM | POA: Diagnosis not present

## 2024-01-13 ENCOUNTER — Other Ambulatory Visit (HOSPITAL_COMMUNITY): Payer: Self-pay

## 2024-01-13 MED ORDER — DOXYCYCLINE HYCLATE 100 MG PO CAPS
100.0000 mg | ORAL_CAPSULE | Freq: Two times a day (BID) | ORAL | 3 refills | Status: AC
  Filled 2024-01-13: qty 60, 30d supply, fill #0
  Filled 2024-02-26: qty 60, 30d supply, fill #1
  Filled 2024-03-03 (×2): qty 60, 30d supply, fill #2

## 2024-01-14 ENCOUNTER — Other Ambulatory Visit (HOSPITAL_COMMUNITY): Payer: Self-pay

## 2024-01-14 ENCOUNTER — Other Ambulatory Visit: Payer: Self-pay

## 2024-01-14 MED ORDER — HYDROCHLOROTHIAZIDE 12.5 MG PO TABS
12.5000 mg | ORAL_TABLET | Freq: Every morning | ORAL | 1 refills | Status: AC
  Filled 2024-01-14: qty 90, 90d supply, fill #0

## 2024-01-14 MED ORDER — LOSARTAN POTASSIUM 100 MG PO TABS
100.0000 mg | ORAL_TABLET | Freq: Every day | ORAL | 1 refills | Status: AC
  Filled 2024-01-14: qty 90, 90d supply, fill #0

## 2024-01-18 ENCOUNTER — Other Ambulatory Visit (HOSPITAL_COMMUNITY): Payer: Self-pay

## 2024-02-10 DIAGNOSIS — L120 Bullous pemphigoid: Secondary | ICD-10-CM | POA: Diagnosis not present

## 2024-02-10 DIAGNOSIS — D225 Melanocytic nevi of trunk: Secondary | ICD-10-CM | POA: Diagnosis not present

## 2024-02-10 DIAGNOSIS — Z08 Encounter for follow-up examination after completed treatment for malignant neoplasm: Secondary | ICD-10-CM | POA: Diagnosis not present

## 2024-02-10 DIAGNOSIS — L570 Actinic keratosis: Secondary | ICD-10-CM | POA: Diagnosis not present

## 2024-02-10 DIAGNOSIS — Z1283 Encounter for screening for malignant neoplasm of skin: Secondary | ICD-10-CM | POA: Diagnosis not present

## 2024-02-10 DIAGNOSIS — Z86006 Personal history of melanoma in-situ: Secondary | ICD-10-CM | POA: Diagnosis not present

## 2024-02-10 DIAGNOSIS — X32XXXD Exposure to sunlight, subsequent encounter: Secondary | ICD-10-CM | POA: Diagnosis not present

## 2024-02-10 DIAGNOSIS — L821 Other seborrheic keratosis: Secondary | ICD-10-CM | POA: Diagnosis not present

## 2024-02-26 ENCOUNTER — Other Ambulatory Visit (HOSPITAL_COMMUNITY): Payer: Self-pay

## 2024-02-28 ENCOUNTER — Other Ambulatory Visit (HOSPITAL_COMMUNITY): Payer: Self-pay

## 2024-03-03 ENCOUNTER — Other Ambulatory Visit (HOSPITAL_COMMUNITY): Payer: Self-pay

## 2024-03-03 ENCOUNTER — Other Ambulatory Visit: Payer: Self-pay

## 2024-03-04 ENCOUNTER — Other Ambulatory Visit (HOSPITAL_COMMUNITY): Payer: Self-pay

## 2024-03-16 ENCOUNTER — Other Ambulatory Visit (HOSPITAL_COMMUNITY): Payer: Self-pay

## 2024-03-17 ENCOUNTER — Other Ambulatory Visit: Payer: Self-pay

## 2024-03-17 ENCOUNTER — Other Ambulatory Visit (HOSPITAL_COMMUNITY): Payer: Self-pay

## 2024-03-17 MED ORDER — ROSUVASTATIN CALCIUM 10 MG PO TABS
10.0000 mg | ORAL_TABLET | Freq: Every day | ORAL | 1 refills | Status: AC
  Filled 2024-03-17: qty 90, 90d supply, fill #0

## 2024-03-23 ENCOUNTER — Encounter: Payer: Self-pay | Admitting: Podiatry

## 2024-03-23 ENCOUNTER — Ambulatory Visit: Admitting: Podiatry

## 2024-03-23 DIAGNOSIS — B351 Tinea unguium: Secondary | ICD-10-CM | POA: Diagnosis not present

## 2024-03-23 DIAGNOSIS — M79609 Pain in unspecified limb: Secondary | ICD-10-CM

## 2024-03-23 NOTE — Progress Notes (Signed)
 This patient presents to the office with chief complaint of long thick painful nails.  Patient says the nails are painful walking and wearing shoes.  This patient is unable to self treat.  This patient is unable to trim his  nails since he is unable to reach his  nails.  he presents to the office for preventative foot care services.  General Appearance  Alert, conversant and in no acute stress.  Vascular  Dorsalis pedis and posterior tibial  pulses are  weakly palpable  bilaterally.  Capillary return is within normal limits  bilaterally. Temperature is within normal limits  bilaterally.  Neurologic  Senn-Weinstein monofilament wire test within normal limits  bilaterally. Muscle power within normal limits bilaterally.  Nails Thick disfigured discolored nails with subungual debris  from hallux to fifth toes bilaterally. No evidence of bacterial infection or drainage bilaterally.  Orthopedic  No limitations of motion  feet .  No crepitus or effusions noted.  No bony pathology or digital deformities noted.  Skin  normotropic skin with no porokeratosis noted bilaterally.  No signs of infections or ulcers noted.     Onychomycosis  Nails  B/L.  Pain in right toes  Pain in left toes  Debridement of nails both feet followed trimming the nails with dremel tool.    RTC 3 months.   Cordella Bold DPM tomma

## 2024-04-05 ENCOUNTER — Other Ambulatory Visit (HOSPITAL_COMMUNITY): Payer: Self-pay

## 2024-04-06 ENCOUNTER — Other Ambulatory Visit (HOSPITAL_COMMUNITY): Payer: Self-pay

## 2024-04-11 ENCOUNTER — Ambulatory Visit: Admitting: Audiologist

## 2024-06-22 ENCOUNTER — Ambulatory Visit: Admitting: Podiatry
